# Patient Record
Sex: Male | Born: 1950 | Race: White | Hispanic: No | Marital: Married | Smoking: Never smoker
Health system: Southern US, Community
[De-identification: ages and names within clinical notes are randomized; demographics above are authoritative.]

## PROBLEM LIST (undated history)

## (undated) HISTORY — PX: NOSE SURGERY: SHX723

---

## 2010-11-13 ENCOUNTER — Ambulatory Visit: Payer: Self-pay | Admitting: Sports Medicine

## 2010-12-23 ENCOUNTER — Ambulatory Visit (INDEPENDENT_AMBULATORY_CARE_PROVIDER_SITE_OTHER): Payer: BC Managed Care – PPO | Admitting: Sports Medicine

## 2010-12-23 ENCOUNTER — Encounter: Payer: Self-pay | Admitting: Sports Medicine

## 2010-12-23 VITALS — BP 119/78 | Ht 72.0 in | Wt 185.0 lb

## 2010-12-23 DIAGNOSIS — M79606 Pain in leg, unspecified: Secondary | ICD-10-CM | POA: Insufficient documentation

## 2010-12-23 DIAGNOSIS — M79609 Pain in unspecified limb: Secondary | ICD-10-CM

## 2010-12-23 NOTE — Assessment & Plan Note (Addendum)
Evidence of hamstring tightness and core hip weakness, mostly abduction.  Advised to continue pilates to increase hamstring flexibility and given exercises to work to increase hip abduction/core strength for overall fitness.  Follow-up as needed.  Also given a series of hamstring exercises to improve strength and lessen the symptoms he gets with hiking. Note his hamstring weakness was mild and worse on the left than the right.  He was also given a compression sleeve to use when he is hiking or doing a lot of physical activity for left hamstring.

## 2010-12-23 NOTE — Progress Notes (Signed)
  Subjective:    Patient ID: Alejandro Ward, male    DOB: [birth year], 61 y.o.   MRN: [id number]  HPI 60 yo male here for overall musculoskeletal wellness evaluation.  Patient works as an Pensions consultant, likes to walk and backpacks annually.  He also plays golf for physical activity.  Goes to the gym several times weekly spending time on the treadmill, cycling, and pilates.  Had history of low back pain, but has resolved since starting pilates in April. Currently has a sensation described as "jelly" or possibly stiffness, achiness in the back of his legs at night and sometimes before starting physical activity.  Resolves after warming up and walking around.    No weakness, numbness, tingling.     Review of Systems See HPI    Objective:   Physical Exam Gen: alert NAD, healthy appearing male  Back Exam: Inspection: normal Motion: good flexion and extension, does not elicit pain SLR lying: normal  Standing HS Flexibility:decreased, able to reach to mid shin before bending knees Palpable tenderness: no FABER: normal  Strength at foot Plantar-flexion: 5 / 5    Dorsi-flexion:  5/ 5    Eversion:  5/ 5   Inversion: 5 / 5 Leg strength Quad: 5 / 5   Hamstring: 4 / 5   Hip flexor:  5/ 5   Hip abductors: 4 / 5 Gait Walking:          Heels: wnl          Toes:   wnl         Tandem: difficulty with strength/balance, unable to cross midline while walking          Assessment & Plan:

## 2012-12-02 ENCOUNTER — Ambulatory Visit (INDEPENDENT_AMBULATORY_CARE_PROVIDER_SITE_OTHER): Payer: BC Managed Care – PPO | Admitting: Family Medicine

## 2012-12-02 ENCOUNTER — Encounter: Payer: Self-pay | Admitting: Family Medicine

## 2012-12-02 VITALS — BP 126/82 | Ht 72.0 in | Wt 190.0 lb

## 2012-12-02 DIAGNOSIS — M12811 Other specific arthropathies, not elsewhere classified, right shoulder: Secondary | ICD-10-CM | POA: Insufficient documentation

## 2012-12-02 DIAGNOSIS — S43421A Sprain of right rotator cuff capsule, initial encounter: Secondary | ICD-10-CM

## 2012-12-02 DIAGNOSIS — S43429A Sprain of unspecified rotator cuff capsule, initial encounter: Secondary | ICD-10-CM

## 2012-12-02 MED ORDER — IBUPROFEN 600 MG PO TABS: ORAL_TABLET | ORAL | Status: DC

## 2012-12-02 NOTE — Progress Notes (Signed)
  Subjective:    Patient ID: Alejandro Ward, male    DOB: 08-Jul-1951, 62 y.o.   MRN: [id number]  HPI  Had a fall in November of 2013 injuring both shoulders and his right leg. He continues to have right shoulder pain since then. He stepped forward and fell off a train platform, scraping his leg and landing on his feet. He was wearing a backpack at that time and tried to catch himself with his arms, ranging his shoulder.  Since then he's had intermittent right shoulder pain it has not really improved. It keeps him awake at night. Certain activities cause increase in pain. He is right-hand dominant. No numbness or tingling in his hand or arm, he's not had any weakness.  Review of Systems Denies fever or, denies swelling of the right upper extremity.    Objective:   Physical Exam  Vital signs are reviewed GENERAL: Well-developed male no acute distress Shoulder, right. Passive full range of motion. Active range of motion limited in supraspinatus testing secondary to pain but the strength is intact. Internal and external rotation intact although somewhat painful on internal rotation. Distally his neuro grossly intact. The bicep is mildly tender to palpation. The bicep is nontender palpation in the tendon is intact. Distally he is neurovascularly intact.      Assessment & Plan:  Rotator cuff strain. We discussed options including rehabilitation program plus minus corticosteroid injection, plus minus nitroglycerin patch. He opted for home exercise rehabilitation program in followup in 4 weeks. I suspect she'll do quite well.

## 2013-01-02 ENCOUNTER — Encounter: Payer: Self-pay | Admitting: Family Medicine

## 2013-01-02 ENCOUNTER — Ambulatory Visit (INDEPENDENT_AMBULATORY_CARE_PROVIDER_SITE_OTHER): Payer: BC Managed Care – PPO | Admitting: Family Medicine

## 2013-01-02 VITALS — BP 131/77 | Ht 72.0 in | Wt 185.0 lb

## 2013-01-02 DIAGNOSIS — Z5189 Encounter for other specified aftercare: Secondary | ICD-10-CM

## 2013-01-02 DIAGNOSIS — S43421D Sprain of right rotator cuff capsule, subsequent encounter: Secondary | ICD-10-CM

## 2013-01-02 NOTE — Patient Instructions (Addendum)
Your insurance requires x-rays prior to MRI so we will set this up. I will call you within a day or so after the MRI results are in. Please call in the interim with problems.

## 2013-01-02 NOTE — Assessment & Plan Note (Signed)
Only 10% improved on home exercise program. Discussed options including corticosteroid injection, but glycerin patch, further imaging. After 2 proceed with MRI. In the interim we'll continue exercises we'll call him after results of MRI.

## 2013-01-02 NOTE — Progress Notes (Signed)
  Subjective:    Patient ID: Alejandro Ward, male    DOB: 1950-09-09, 62 y.o.   MRN: [id number]  HPI Followup right shoulder pain. Has been doing exercises regularly. Has also started doing breaststroke swimming. Swelling seems to improve things. New symptom of mid bicep pain. Overall he is only 10% improved.   Review of Systems No new finger or hand numbness.    Objective:   Physical Exam Vital signs are reviewed GENERAL: Well-developed male no acute distress SHOULDER: Bilaterally symmetrical RIGHT: Full range of motion with normal strength in all planes the rotator cuff. Biceps tendon is tender to palpation at the origin and down to the midportion of the tendon. Distally he is neurovascularly intact       Assessment & Plan:

## 2013-01-12 ENCOUNTER — Ambulatory Visit
Admission: RE | Admit: 2013-01-12 | Discharge: 2013-01-12 | Disposition: A | Discharge status: Home / Self Care | Payer: BC Managed Care – PPO | Source: Ambulatory Visit | Attending: Family Medicine | Admitting: Family Medicine

## 2013-01-12 DIAGNOSIS — S43421D Sprain of right rotator cuff capsule, subsequent encounter: Secondary | ICD-10-CM

## 2013-01-17 ENCOUNTER — Other Ambulatory Visit: Payer: BC Managed Care – PPO

## 2013-01-17 ENCOUNTER — Ambulatory Visit (INDEPENDENT_AMBULATORY_CARE_PROVIDER_SITE_OTHER): Payer: BC Managed Care – PPO | Admitting: Sports Medicine

## 2013-01-17 VITALS — BP 110/70 | Ht 72.0 in | Wt 185.0 lb

## 2013-01-17 DIAGNOSIS — M719 Bursopathy, unspecified: Secondary | ICD-10-CM

## 2013-01-17 DIAGNOSIS — M12519 Traumatic arthropathy, unspecified shoulder: Secondary | ICD-10-CM

## 2013-01-17 DIAGNOSIS — M7550 Bursitis of unspecified shoulder: Secondary | ICD-10-CM

## 2013-01-17 DIAGNOSIS — M12811 Other specific arthropathies, not elsewhere classified, right shoulder: Secondary | ICD-10-CM

## 2013-01-17 MED ORDER — NITROGLYCERIN 0.2 MG/HR TD PT24: 1.0000 | MEDICATED_PATCH | Freq: Every day | TRANSDERMAL | Status: DC

## 2013-01-17 MED ORDER — MELOXICAM 15 MG PO TABS: 15.0000 mg | ORAL_TABLET | Freq: Every day | ORAL | Status: DC

## 2013-01-17 NOTE — Patient Instructions (Addendum)
For the biceps tear and rotator cuff tear, we are going to start nitroglycerin patches.  Cut patch into one - fourth pieces. Place a one fourth piece of patch on  skin over affected area, changing to a new piece every 24 hours.  Also use the anti inflammatory medicine mobic daily with food.   Do the exercise to continue with mobility of the shoulder.   Follow up in 1 month.

## 2013-01-17 NOTE — Assessment & Plan Note (Signed)
We will try NTG for left shoulder as well  No tear noted on this side

## 2013-01-17 NOTE — Progress Notes (Signed)
  Subjective:    Patient ID: Katie Moch, male    DOB: 1950/11/30, 62 y.o.   MRN: [id number]  HPI 62 yo male presents for follow up of shoulder pain. Pain was initially present in right shoulder but he now reports pain in left shoulder as well.  Initial injury occurred when falling in between a track and a train after slipping off the stairs to the train last November. He doesn't remember exactly how he fell, but he had a backpack on and he was then lifted out by the arms and shoulders. This was in [place]. He since then has had pain in the anterior/biceps area of the right shoulder. He was seen in May and started doing strengthening exercises bilaterally and started noticing pain in the left shoulder as well.  Pain is worst with putting on jackets and reaching backwards. It is also worst at night and wakes him up from sleep. He denies any numbness, tingling or weakness.  He has been taking ibuprofen which helps when he takes it at a higher dose.    Review of Systems Negative except per HPI    Objective:   Physical Exam Filed Vitals:   01/17/13 1424  BP: 110/70   General: pleasant gentleman in no acute distress Shoulder: no soft tissue swelling, joint effusion or skin discoloration bilaterally No tenderness along AC joint bilaterally, tenderness along proximal biceps on right.  Normal strength with external and internal rotation bilaterally, Pain with internal rotation and adduction bilaterally, scratch test painful bilaterally R>L,  pain with elevation of arms above head bilaterally Normal Hawkins Negative Empty Can Negative O'Briens Normal speeds   Ultrasound:  Right shoulder:  Partial tear of bicipital tendon supraspinatus tear with calcifications at distal end of tendon with significantly increased doppler flow, Subacromial bursitis with increased fluid AC jont only mild DJD  Left shoulder: no evidence of tear or increased doppler flow. Subacromial bursitis present.  RC  tendons look normal;  AC joint normal.  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 Very mild acromial spurring is present. There is also mild degenerative change involving the right AC joint. No acute fracture is seen.  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 No acute abnormality.     Assessment & Plan:  62 yo male who presents with bilateral shoulder pain, right worst than left.  Right shoulder shows evidence of partial bicipital tendon and supraspinatus tears. Bilateral bursitis is also present and likely contributing to symptoms.  - start nitroglycerin protocol 1/4 patch for partial rotator cuff tear - start simple and gentle exercises that promote mobility of shoulder to avoid frozen shoulder.  - start mobic daily for bursitis - follow up in 1 month with potential for repeat US to monitor progress of healing.

## 2013-01-17 NOTE — Assessment & Plan Note (Signed)
Limit to motion exercises  NTG protocol  meloxicam  Reck 1 mo  If pain too severe we will inject for relief

## 2013-03-01 ENCOUNTER — Ambulatory Visit (INDEPENDENT_AMBULATORY_CARE_PROVIDER_SITE_OTHER): Payer: BC Managed Care – PPO | Admitting: Sports Medicine

## 2013-03-01 VITALS — BP 134/80 | Ht 72.0 in | Wt 185.0 lb

## 2013-03-01 DIAGNOSIS — M12519 Traumatic arthropathy, unspecified shoulder: Secondary | ICD-10-CM

## 2013-03-01 DIAGNOSIS — M7500 Adhesive capsulitis of unspecified shoulder: Secondary | ICD-10-CM

## 2013-03-01 DIAGNOSIS — Z0000 Encounter for general adult medical examination without abnormal findings: Secondary | ICD-10-CM

## 2013-03-01 DIAGNOSIS — M75101 Unspecified rotator cuff tear or rupture of right shoulder, not specified as traumatic: Secondary | ICD-10-CM

## 2013-03-01 NOTE — Assessment & Plan Note (Signed)
I think the key issue in his shoulder at this time as restoring normal range of motion  I kept him on a series of Codman exercises  I added some more aggressive overhead stretching  Since he feels he is improving --RTC again in 4-6 weeks  He will take vitamin C 500 but does not want to take other medications

## 2013-03-01 NOTE — Assessment & Plan Note (Signed)
Strength is better and he seems to be healing  We will repeat his ultrasound scan of the next visit

## 2013-03-01 NOTE — Progress Notes (Signed)
Patient ID: Alejandro Ward, male   DOB: [birth year], 62 y.o.   MRN: [id number]  Now returning with some improvement in shoulder sxs Now with less pain Did not use NTG patches as pain was improving He still has stiffness but is able to get better motion in both shoulders When he tried some strengthening exercises originally got worse With the Codman series he is now able to climb the walls and get a better stretch behind his back   Originally fell from train step 11 mos ago Pulled up by arms - landed on bottom  Strong Fam Hx of DM No known thyroid probs  Mother died at 18 with DM and was thin  Physical examination No acute distress  He is lacking more than 30 of elevation on both sides Him back scratch she can only get to L2 on both sides External rotation and is normal on the right but about 15 limited on the left Internal rotation in front of his body is relatively normal  Today rotator cuff testing including speeds, Yergason's, empty can and Hawkins tests are all normal and do not precipitate padding Strength is good to abduction and to initiation of elevation and with internal and external rotation

## 2013-03-01 NOTE — Patient Instructions (Addendum)
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

## 2013-03-02 ENCOUNTER — Other Ambulatory Visit: Payer: Self-pay | Admitting: *Deleted

## 2013-03-02 DIAGNOSIS — M7500 Adhesive capsulitis of unspecified shoulder: Secondary | ICD-10-CM

## 2013-03-10 ENCOUNTER — Other Ambulatory Visit: Payer: BC Managed Care – PPO

## 2013-03-10 DIAGNOSIS — M7500 Adhesive capsulitis of unspecified shoulder: Secondary | ICD-10-CM

## 2013-03-10 NOTE — Progress Notes (Signed)
TSH AND GLUCOSE DONE TODAY,PT DID STATE HAD A GLASS OF OJ TODAY Alejandro Ward

## 2013-03-11 LAB — GLUCOSE, FASTING: Glucose, Fasting: 87 mg/dL (ref 70–99)

## 2013-03-14 ENCOUNTER — Telehealth: Payer: Self-pay | Admitting: *Deleted

## 2013-03-14 NOTE — Telephone Encounter (Signed)
Left pt a VM to call back

## 2013-03-14 NOTE — Telephone Encounter (Signed)
Message copied by Mora Bellman on Tue Mar 14, 2013  5:31 PM ------      Message from: Enid Baas      Created: Tue Mar 14, 2013  4:53 PM       Let him know that his laboratory tests were normal. I do not think these are causing his shoulder problems.Ask how his shoulder is doing. ------

## 2013-03-16 NOTE — Telephone Encounter (Signed)
Spoke with pt, advised him of normal labs.  He states his shoulder is feeling better, still some pain with certain motions.  He will follow up 04/05/13.

## 2013-04-05 ENCOUNTER — Ambulatory Visit (INDEPENDENT_AMBULATORY_CARE_PROVIDER_SITE_OTHER): Payer: BC Managed Care – PPO | Admitting: Sports Medicine

## 2013-04-05 ENCOUNTER — Encounter: Payer: Self-pay | Admitting: Sports Medicine

## 2013-04-05 VITALS — BP 100/67 | HR 59 | Ht 72.0 in | Wt 185.0 lb

## 2013-04-05 DIAGNOSIS — M12811 Other specific arthropathies, not elsewhere classified, right shoulder: Secondary | ICD-10-CM

## 2013-04-05 DIAGNOSIS — M7501 Adhesive capsulitis of right shoulder: Secondary | ICD-10-CM

## 2013-04-05 DIAGNOSIS — M12519 Traumatic arthropathy, unspecified shoulder: Secondary | ICD-10-CM

## 2013-04-05 DIAGNOSIS — M7500 Adhesive capsulitis of unspecified shoulder: Secondary | ICD-10-CM

## 2013-04-05 MED ORDER — AMITRIPTYLINE HCL 25 MG PO TABS: 25.0000 mg | ORAL_TABLET | Freq: Every day | ORAL | Status: DC

## 2013-04-05 NOTE — Patient Instructions (Signed)
Thank you for coming in today  Continue your home exercises for range of motion Continue vitamin C Try amitriptyline at night. Monitor for urinary problems

## 2013-04-05 NOTE — Progress Notes (Signed)
CC: Followup right shoulder pain HPI: Patient is a very pleasant 62 year old male who we have been seeing for right shoulder pain. His initial injury occurred when he had a fall while hiking in [place] and injured his right shoulder. He had evidence of partial supraspinatus tear as well as partial biceps tendon tear on initial ultrasound. He subsequently developed frozen shoulder. He presents for followup today. Patient states that his pain and range of movement motions seem to be improving. He does have some pain intermittently especially with putting on a sport coat. He is doing his home exercises for range of motion and notices that his flexibility seems to be improving. He has minimal pain at night and is sleeping better through the night. He never used the nitroglycerin because he was concerned about side effects. He is on the vitamin C.  ROS: As above in the HPI. All other systems are stable or negative.  OBJECTIVE: APPEARANCE:  Patient in no acute distress.The patient appeared well nourished and normally developed. HEENT: No scleral icterus. Conjunctiva non-injected Resp: Non labored Skin: No rash MSK:  Right Shoulder - No swelling or deformity - No TTP over AC joint, biceps tendon, or lateral humerus - Some loss of ROM. Loss of 20 of forward flexion. Internal rotation on back scratch limited to upper lumbar vertebra. External rotation to 30.  - Strength 5/5 on shoulder abduction, internal rotation, external rotation, empty can - Negative empty can, Hawkin's, Neer's for impingement - Labrum: Negative O'Briens - Neurovascularly intact   MSK [place]: Ultrasound of the right shoulder was performed in transverse and longitudinal views. Biceps tendon was visualized and shows hypoechoic change in the area surrounding the tendon. Previous gap appears decreased in size and there is scar tissue hyperechoic change present. Supraspinatus is without tear and appears well-healed. Subscap, teres Minor,  infraspinatus also visualized and normal in appearance. No impingement present on dynamic testing of the subscapularis or supraspinatus.  ASSESSMENT: #1. Right rotator cuff previous partial tear of the supraspinatus and biceps tendon complicated by frozen shoulder now with good evidence of healing of the supraspinatus and improving range of motion with persistent swelling around the biceps tendon  PLAN: Patient seems to be making good progress at this time. His pain is decreased and he is sleeping better at night. Ultrasound shows good progression of healing at this point. He still does have loss of range of motion consistent with frozen shoulder at this point. We would like him to try amitriptyline 25 mg each bedtime. He will continue his range of motion exercises at home. Continue vitamin C. We'll have her followup with [place] in about 6 weeks.

## 2013-05-18 ENCOUNTER — Ambulatory Visit (INDEPENDENT_AMBULATORY_CARE_PROVIDER_SITE_OTHER): Payer: BC Managed Care – PPO | Admitting: Sports Medicine

## 2013-05-18 ENCOUNTER — Encounter: Payer: Self-pay | Admitting: Sports Medicine

## 2013-05-18 VITALS — BP 123/75 | Ht 72.0 in | Wt 185.0 lb

## 2013-05-18 DIAGNOSIS — M7500 Adhesive capsulitis of unspecified shoulder: Secondary | ICD-10-CM

## 2013-05-18 NOTE — Assessment & Plan Note (Signed)
He is making good progress in getting back to 100% normal range of motion  Overall my exam he has some limitations of motion he doesn't feel that this affects anything that he's doing on a daily basis  He would like to try some golf but has been hesitant   I advised him to continue on his motion exercises and had a very light dumbbell to increase the motion He can increase his activities even hitting a few golf balls if desired  Recheck with me in 2 months to see if this motions  is closer to 100%

## 2013-05-18 NOTE — Progress Notes (Signed)
Patient ID: Alejandro Ward, male   DOB: Jan 05, 1951, 62 y.o.   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 is somewhat his left He started with an injury from a fall about one year ago while traveling in [place] This but when he started his exercises for rotator cuff rehabilitation the shoulder started getting stiff His original injury involved a supraspinatous tear and a partial biceps tendon tear On ultrasound supraspinatous tear appeared healed and the biceps tendon tear with significantly improved  However his motion came worse We started him on progressive range of motion exercises He does not like taking medications and hasn't taken that much medicine but is taking vitamin C  By early fall his left shoulder was starting to get somewhat stiff so started the same motions with that shoulder  He feels that he has almost full range of motion of both shoulders He is not having much pain  Physical examination  No acute distress  Right shoulder shows about 20-30 of lack of full forward flexion and elevation He lacks about 15 of external rotation Back scratch is to lumbar 1 Strength tested at waist level is normal with no pain Abduction and elevation is 150  Left shoulder lacks 20 of full forward flexion and elevation He lacks 15 of external rotation Back scratch is to T12 Abduction and elevation is 160

## 2013-06-16 ENCOUNTER — Encounter: Payer: Self-pay | Admitting: Family Medicine

## 2013-06-16 ENCOUNTER — Ambulatory Visit (INDEPENDENT_AMBULATORY_CARE_PROVIDER_SITE_OTHER): Payer: BC Managed Care – PPO | Admitting: Family Medicine

## 2013-06-16 ENCOUNTER — Ambulatory Visit (HOSPITAL_BASED_OUTPATIENT_CLINIC_OR_DEPARTMENT_OTHER)
Admission: RE | Admit: 2013-06-16 | Discharge: 2013-06-16 | Disposition: A | Discharge status: Home / Self Care | Payer: BC Managed Care – PPO | Source: Ambulatory Visit | Attending: Family Medicine | Admitting: Family Medicine

## 2013-06-16 VITALS — BP 142/80 | HR 62 | Ht 72.0 in | Wt 185.0 lb

## 2013-06-16 DIAGNOSIS — R51 Headache: Secondary | ICD-10-CM

## 2013-06-16 DIAGNOSIS — R42 Dizziness and giddiness: Secondary | ICD-10-CM

## 2013-06-16 NOTE — Patient Instructions (Signed)
Get the CT scan today - we will contact you with the results. If this is normal start taking a baby aspirin 81mg  daily with food. Start exercises shown - do 10 times once or twice a day until vertigo has completely resolved. Consider meclizine. Generally follow up with your family doctor in 1 week to reassess. If you have problems with vision, speech, weakness, numbness, call 911.

## 2013-06-19 ENCOUNTER — Encounter: Payer: Self-pay | Admitting: Family Medicine

## 2013-06-19 DIAGNOSIS — R42 Dizziness and giddiness: Secondary | ICD-10-CM | POA: Insufficient documentation

## 2013-06-19 NOTE — Progress Notes (Signed)
Patient ID: Alejandro Ward, male   DOB: [birth year], 62 y.o.   MRN: [id number]  PCP: No PCP Per Patient  Subjective:   HPI: Patient is a 62 y.o. male here for dizziness.  Patient reports just since this morning he has had intermittent dizziness, room spinning sensation. One time was worse when he stood up quickly. Saw spots before his eyes. Eating, drinking orange juice did not help. No problems like this before. No changes in strength, sensation of extremities, face. Stuttering but reports this is normal for him. Also reports off and on headaches the past few weeks. Has not tried anything for this. Blood pressure elevated today but denies any other medical problems.  History reviewed. No pertinent past medical history.  No current outpatient prescriptions on file prior to visit.   No current facility-administered medications on file prior to visit.    Past Surgical History  Procedure Laterality Date  . Nose surgery      broke as a child in football    No Known Allergies  History   Social History  . Marital Status: Married    Spouse Name: N/A    Number of Children: N/A  . Years of Education: N/A   Occupational History  . Not on file.   Social History Main Topics  . Smoking status: Never Smoker   . Smokeless tobacco: Not on file  . Alcohol Use: Not on file  . Drug Use: Not on file  . Sexual Activity: Not on file   Other Topics Concern  . Not on file   Social History Narrative  . No narrative on file    Family History  Problem Relation Age of Onset  . Diabetes Mother   . Hypertension Mother   . Heart attack Father   . Hyperlipidemia Neg Hx   . Sudden death Neg Hx     BP 142/80  Pulse 62  Ht 6' (1.829 m)  Wt 185 lb (83.915 kg)  BMI 25.08 kg/m2  Review of Systems: See HPI above.    Objective:  Physical Exam:  Gen: NAD  Neuro: A&O x 3 CN 2 - 12 grossly intact.  EOMI, PERRL.  Hearing intact to finger rub.  Pharynx symmetric. Strength 5/5 all  muscle groups bilateral upper and lower extremities. No dysdiadochokinesia. Finger to nose normal. Sensation intact to light touch all extremities    Assessment & Plan:  1. Dizziness - describes new onset vertigo.  Coming and going today not necessarily with movements.  Also reports increase in headache frequency past few weeks.  Advised in up to 10-20% of cases this can be initial presentation of a CVA - go ahead with CT without contrast which was done today and was negative.  Advised he establish care with a PCP (states he has seen Dr. Darrick Penna but that appears to only have been for shoulder pain).  Consider ENT referral if this continues.  Discussed red flags (worsening vertigo, vomiting, vision or speech changes, weakness, sensation change) to warrant calling 911.

## 2013-06-19 NOTE — Assessment & Plan Note (Signed)
describes new onset vertigo.  Coming and going today not necessarily with movements.  Also reports increase in headache frequency past few weeks.  Advised in up to 10-20% of cases this can be initial presentation of a CVA - go ahead with CT without contrast which was done today and was negative.  Advised he establish care with a PCP (states he has seen Dr. Darrick Penna but that appears to only have been for shoulder pain).  Consider ENT referral if this continues.  Discussed red flags (worsening vertigo, vomiting, vision or speech changes, weakness, sensation change) to warrant calling 911.

## 2013-07-04 IMAGING — CR DG SHOULDER 2+V*R*
3 series · 3 of 3 positions shown · non-contrast
Comparison: None.

CLINICAL DATA: Fell onto right shoulder 6 months ago with pain,
limited range of motion

RIGHT SHOULDER - 2+ VIEW

[view not recorded (1 of 3)]
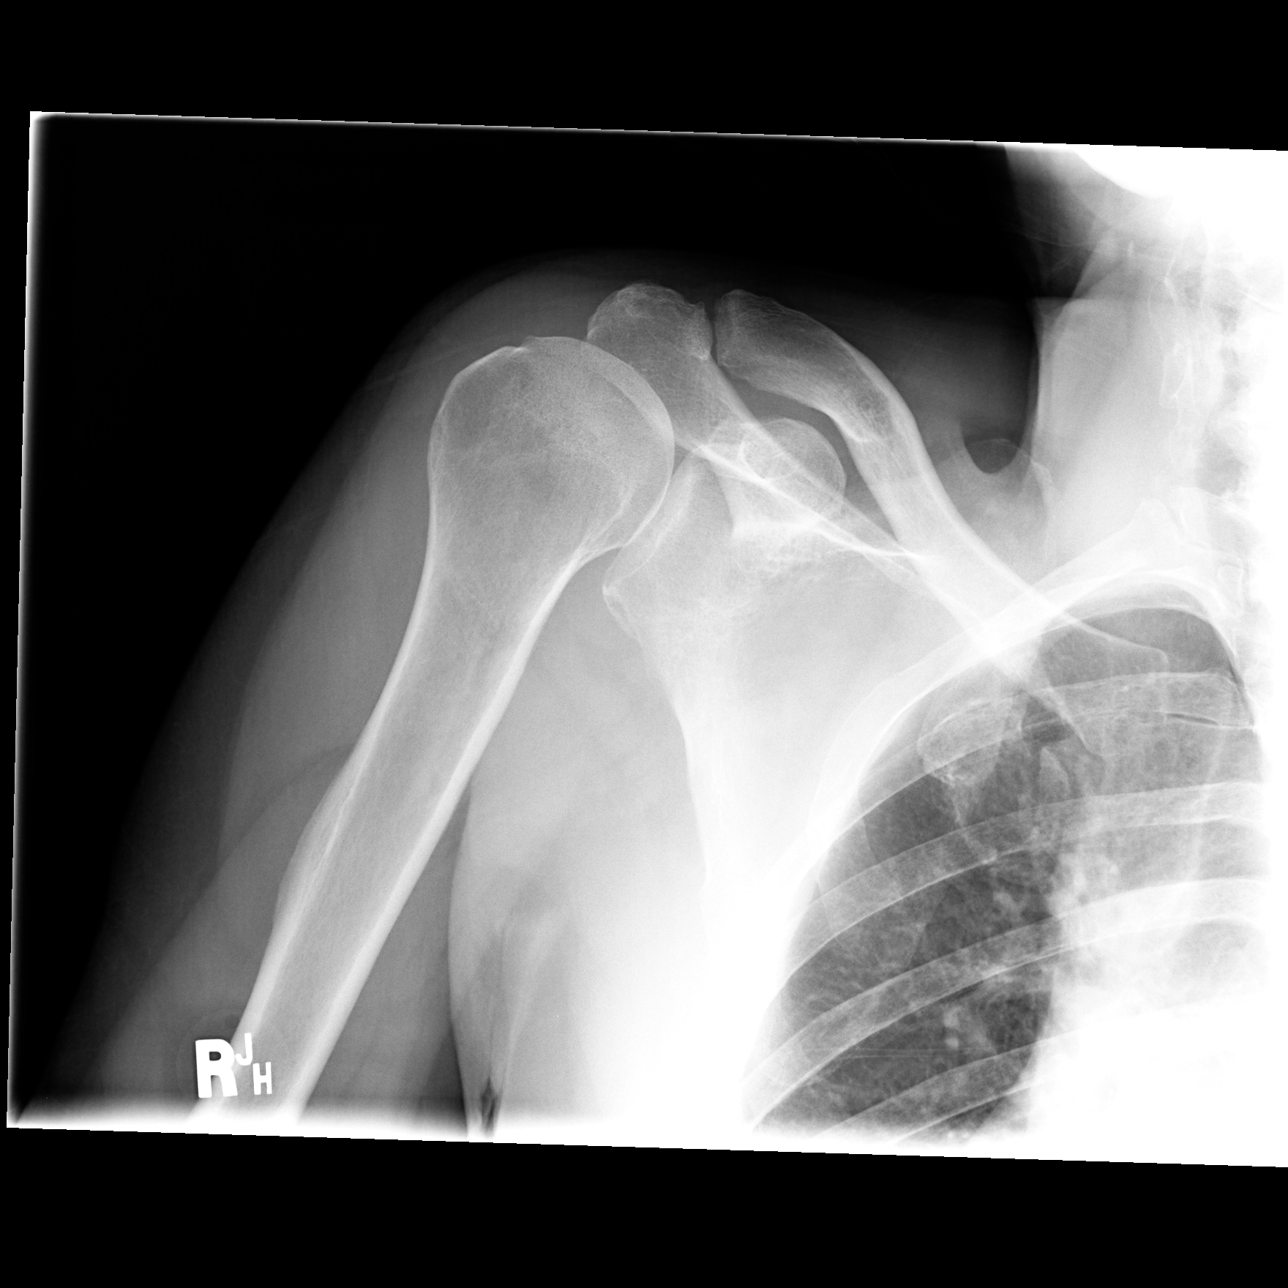

[view not recorded (2 of 3)]
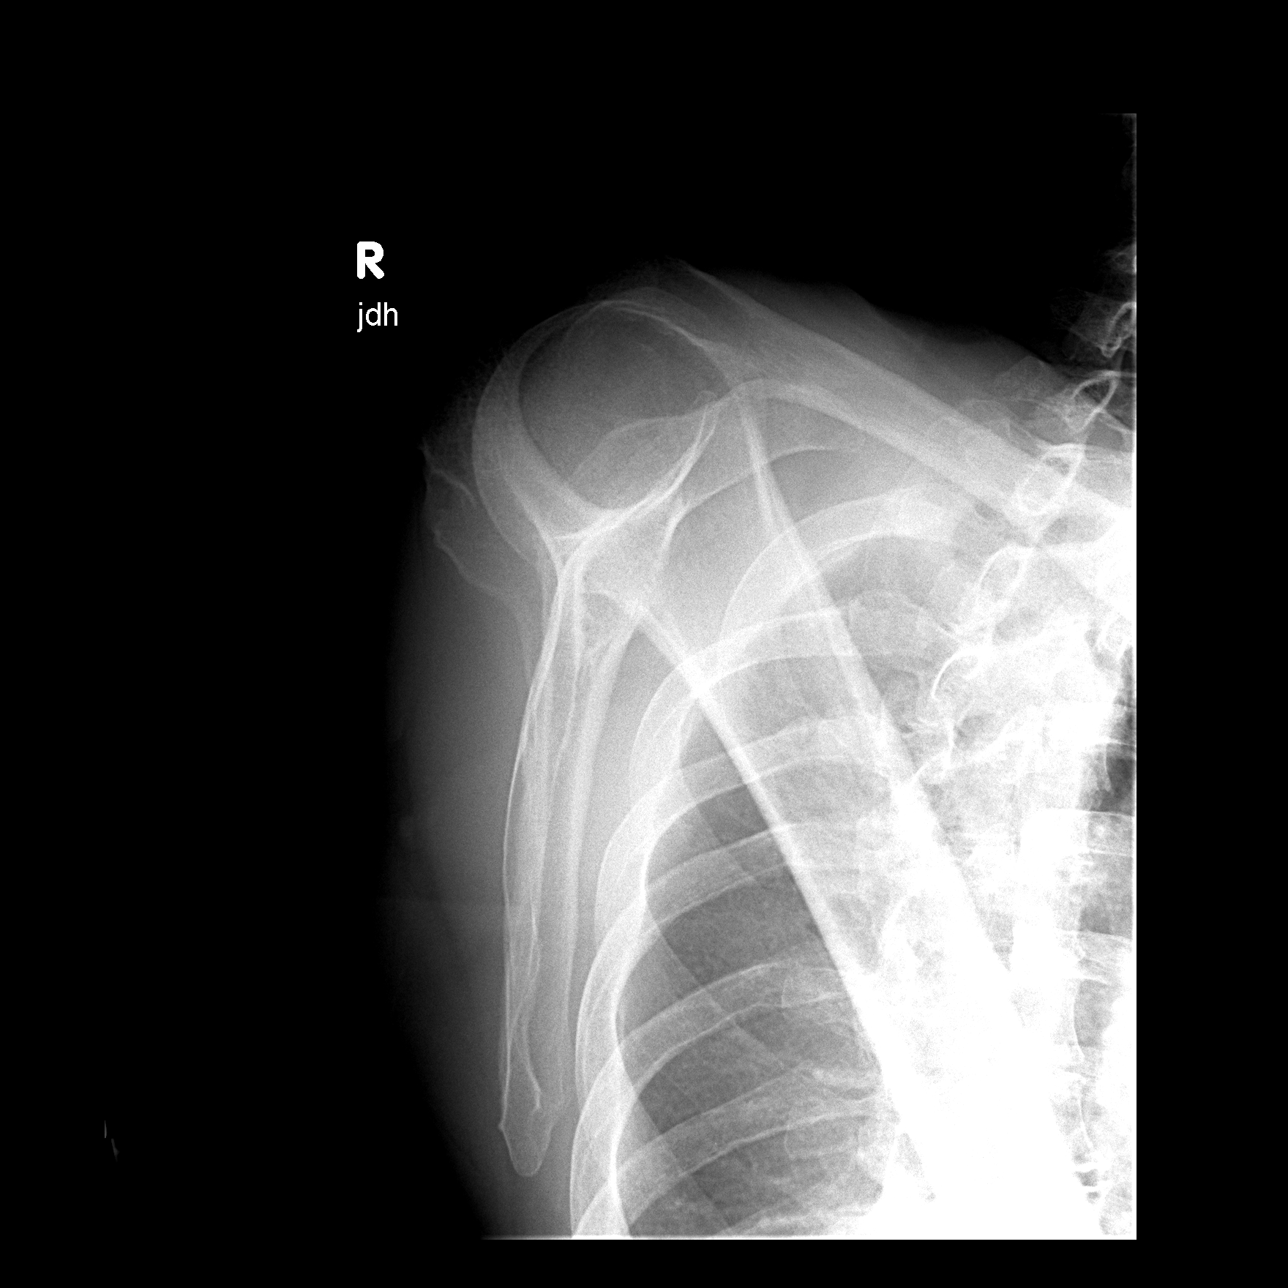

[view not recorded (3 of 3)]
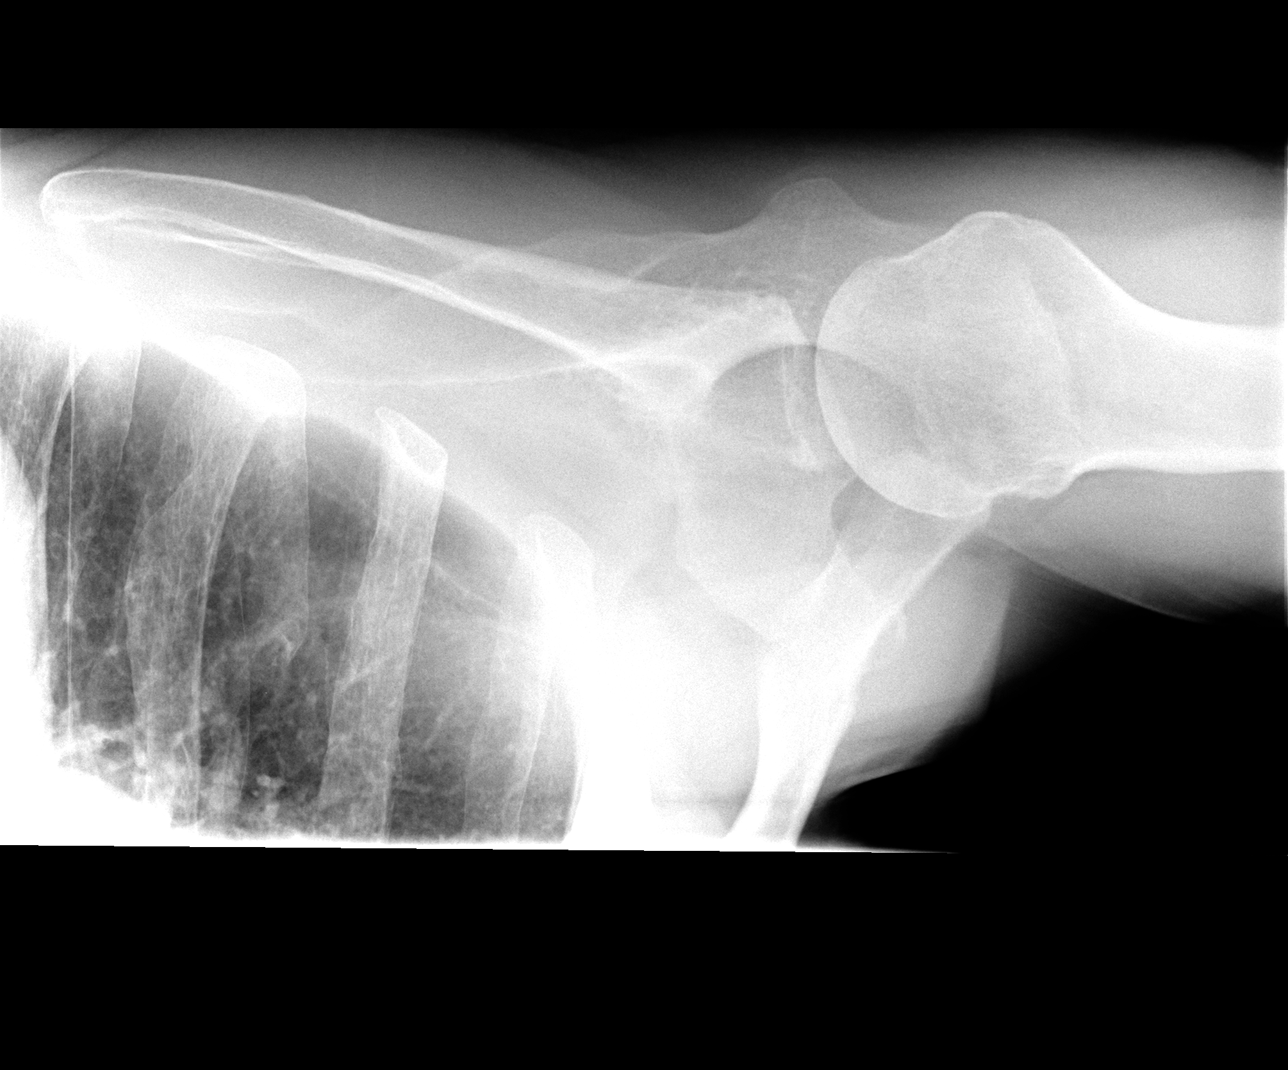

[3 of 3 positions shown; findings below may reference images not displayed]

FINDINGS: There is mild degenerative joint disease of the right
glenohumeral joint space with slight loss of joint space and mild
sclerosis.  Very mild acromial spurring is present.  There is also
mild degenerative change involving the right AC joint.  No acute
fracture is seen.
IMPRESSION: Mild degenerative change of the glenohumeral joint and right AC
joint.  No acute abnormality.

## 2013-08-09 ENCOUNTER — Encounter: Payer: Self-pay | Admitting: Sports Medicine

## 2013-08-09 ENCOUNTER — Ambulatory Visit (INDEPENDENT_AMBULATORY_CARE_PROVIDER_SITE_OTHER): Payer: BC Managed Care – PPO | Admitting: Sports Medicine

## 2013-08-09 VITALS — BP 132/79 | HR 66 | Ht 72.0 in | Wt 185.0 lb

## 2013-08-09 DIAGNOSIS — M7501 Adhesive capsulitis of right shoulder: Secondary | ICD-10-CM

## 2013-08-09 DIAGNOSIS — M7500 Adhesive capsulitis of unspecified shoulder: Secondary | ICD-10-CM

## 2013-08-09 NOTE — Patient Instructions (Signed)
You have resolved your frozen shoulder, you are at higher risk of getting frozen shoulder on left side Minimal chance of getting recurrence on rt shoulder  Continue doing 1 set of motion exercises  Use light weight dumbbell and raise straight ahead, 45 deg, 90 deg, and 135 deg  If you start having tingling during hiking- switch backpack to other shoulder  May need to adjust pack- take foam incase you need to pad strap Take ibuprofen as needed   Please follow up as needed, and enjoy your hiking trip!!  Thank you for seeing [place] today!

## 2013-08-09 NOTE — Progress Notes (Signed)
   Subjective:    Patient ID: Alejandro Ward, male    DOB: [birth year], 63 y.o.   MRN: [id number]  HPI  Pt presents to clinic for f/u of rt shoulder which is significantly improved.   Still notices some discomfort with reaching backward. Sleeping much better. Occasionally takes ibuprofen. Doing recommended shoulder exercises daily.    Review of Systems     Objective:   Physical Exam Strong  NAD  Rt shoulder exam:  Full forward flexion, abduction, and elevation  IR - lacks 5 deg T12 on back scratch, T10 on left side Frozen shoulder test negative today       Assessment & Plan:

## 2013-08-09 NOTE — Assessment & Plan Note (Signed)
This has resolved clincally at this point  Keep up some maintenance exercises and low grade RC strength work  He can recheck with me as needed with plan in 3 to 4 mos

## 2014-01-03 ENCOUNTER — Ambulatory Visit: Payer: BC Managed Care – PPO | Admitting: Emergency Medicine

## 2014-02-07 ENCOUNTER — Ambulatory Visit (INDEPENDENT_AMBULATORY_CARE_PROVIDER_SITE_OTHER): Payer: BC Managed Care – PPO | Admitting: Sports Medicine

## 2014-02-07 ENCOUNTER — Encounter: Payer: Self-pay | Admitting: Sports Medicine

## 2014-02-07 ENCOUNTER — Encounter (INDEPENDENT_AMBULATORY_CARE_PROVIDER_SITE_OTHER): Payer: Self-pay

## 2014-02-07 VITALS — BP 149/86 | HR 60

## 2014-02-07 DIAGNOSIS — M533 Sacrococcygeal disorders, not elsewhere classified: Secondary | ICD-10-CM | POA: Diagnosis not present

## 2014-02-07 NOTE — Patient Instructions (Signed)
[place] 03/02/14 AT 9AM Bovill Witmer [phone]

## 2014-02-07 NOTE — Assessment & Plan Note (Signed)
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

## 2014-02-07 NOTE — Progress Notes (Signed)
Patient ID: Alejandro Ward, male   DOB: [birth year], 63 y.o.   MRN: [id number]  RT LBP to RT SIJ area 1 mo duration Finished 1000K trek 22nd May  Walking [place] about May 31 and this was difficult with pulling  No radiating sxs  Cough and laughing hurt Some pain with defecation  Now has pain worse at night Points to RT SIJ No weakness  Secondary issue is hearing difficulty We will schedule him for audiology evaluation and get him to establish with a PCP  Exam NAD BP 149/86  Pulse 60  Patient is able to do normal heel toe and tandem walk No strength deficits noted in the lower extremity Flexion to 45 brings out mild pain over the right SI joint Extension is only slightly painful Lateral been slight pain to the right Rotation no pain  Hip range of motion was normal Pelvic rock shows good motion of both SI joints  Tenderness to palpation directly over the right SI joint  Standing hip rotation and a side blank are difficult and cause some pain Back bridge causes pain on the right SI joint

## 2014-03-14 ENCOUNTER — Ambulatory Visit (INDEPENDENT_AMBULATORY_CARE_PROVIDER_SITE_OTHER): Payer: BC Managed Care – PPO | Admitting: Sports Medicine

## 2014-03-14 ENCOUNTER — Encounter: Payer: Self-pay | Admitting: Sports Medicine

## 2014-03-14 VITALS — BP 118/75 | HR 60 | Ht 74.0 in | Wt 190.0 lb

## 2014-03-14 DIAGNOSIS — M533 Sacrococcygeal disorders, not elsewhere classified: Secondary | ICD-10-CM | POA: Diagnosis not present

## 2014-03-14 NOTE — Assessment & Plan Note (Signed)
Chronic right sided SI joint and iliolumbar ligament pain.  Complicated by weak core muscles, and weak hip abductor muscles. -Given that he has somewhat failed a home exercise program, we will refer him for a course of formal physical therapy to assist him with reactivating his weakend muscles. -He may continue to take Aleve as needed. -He should follow up in 4 weeks or sooner if need to reassess.

## 2014-03-14 NOTE — Progress Notes (Signed)
   Subjective:    Patient ID: Alejandro Ward, male    DOB: 10-Jul-1951, 63 y.o.   MRN: [id number]  HPI Alejandro Ward Visit 63 year old male who presents for followup of right sided SI joint and low back pain.  He notes that his symptoms were aggravated 2 weeks ago following a trip from [place] to [place].  Over the past week his symptoms have been improving, though wake him up at least once a night.  He is lying down aggravates his symptoms.  He was previously doing hip and SI joint exercises, which she was taught, but has since ceased thing diligent about his exercises.  He will occasionally take Aleve which provides some relief of pain.  He is overall active with his wife in mainly walking.  He denies any radiation of pain down the posterior of her contralateral leg, weakness, numbness, tingling, or deep groin pain.  No past medical history on file. History  Substance Use Topics  . Smoking status: Never Smoker   . Smokeless tobacco: Not on file  . Alcohol Use: Not on file    Review of Systems Pertinent positive: Right SI joint pain, right low back/gluteal pain. Pertinent negative: weakness, numbness, tingling, or deep groin pain 11 point review of systems was performed as otherwise negative.    Objective:   Physical Exam BP 118/75  Pulse 60  Ht 6\' 2"  (1.88 m)  Wt 190 lb (86.183 kg)  BMI 24.38 kg/m2 GEN: The patient is well-developed well-nourished male and in no acute distress.  He is awake alert and oriented x3. SKIN: warm and well-perfused, no rash  EXTR: No lower extremity edema or calf tenderness Neuro: Strength 5/5 globally. Sensation intact throughout. DTRs 2/4 bilaterally. No focal deficits. Vasc: +2 bilateral distal pulses. No edema.  MSK: No leg length discrepancy noted.  Standard structural exam overall unremarkable.  Pelvic compression test with equal movement at the SI joints bilaterally. Weakness with single leg squat right greater than left.  Weak hip abductors on the  right greater than left.  Tenderness to palpation over the right SI joint, but now also mainly at the right iliolumbar ligament.  No bony tenderness is noted.  Negative sork test.  Full range of motion bilateral hip joints with internal and external rotation that is pain free. No tenderness at the ischial tuberosity or hamstring insertion.     Assessment & Plan:  Please see problem list assessment and plan problem list.

## 2014-03-20 ENCOUNTER — Ambulatory Visit: Payer: BC Managed Care – PPO | Admitting: Physical Therapy

## 2014-03-21 ENCOUNTER — Encounter: Payer: Self-pay | Admitting: Sports Medicine

## 2014-03-21 ENCOUNTER — Ambulatory Visit: Payer: BC Managed Care – PPO

## 2014-03-22 ENCOUNTER — Ambulatory Visit: Payer: Self-pay | Admitting: Sports Medicine

## 2014-04-19 ENCOUNTER — Encounter: Payer: Self-pay | Admitting: Sports Medicine

## 2014-04-19 ENCOUNTER — Ambulatory Visit (INDEPENDENT_AMBULATORY_CARE_PROVIDER_SITE_OTHER): Payer: BC Managed Care – PPO | Admitting: Sports Medicine

## 2014-04-19 VITALS — BP 136/89 | HR 72 | Ht 74.0 in | Wt 190.0 lb

## 2014-04-19 DIAGNOSIS — M533 Sacrococcygeal disorders, not elsewhere classified: Secondary | ICD-10-CM

## 2014-04-19 NOTE — Progress Notes (Signed)
  Alejandro Ward - 63 y.o. male MRN [id number]  Date of birth: July 13, 1951  SUBJECTIVE:  Including CC & ROS.  The patient is here to follow up:  Right Low Back Pain & Right Hip Pain  HISTORY:  Alejandro Ward is a pleasant 63 y.o. male who returns for follow-up to right hip pain and low back pain. He states that the low back pain is essentially a non-issue and in regards to his right hip, this has significantly improved. He cites prior nightly early morning waking due to right hip / deep gluteal pain which now only occurs 1-2 times per week. The severity has decreased as well. Pain is still located right lateral hip to right deep buttock, no radiation down the leg, no numbness, tingling.   He has been compliant with his home exercises with the assistance of his wife. He was re-referred to physical therapy but chose to pursue home exercises instead. He has not needed to take Aleve anymore. He continues to remain active with walking and going to the gym.  ROS: No fevers, chills, trauma, otherwise per HPI  OBJECTIVE FINDINGS:  VS:  Filed Vitals:   04/19/14 0827  BP: 136/89  Pulse: 72  Height: 6\' 2"  (1.88 m)  Weight: 190 lb (86.183 kg)   PHYSICAL EXAM:            GENERAL:  Caucasian male. In no discomfort; no respiratory distress                PSYCH:  alert and appropriate, good insight  Right hip Exam:              APPEAR:  No gross abnormality or skin discoloration noted                    ROM:  Full and painless flexion, extension, internal, external noted. FABER painless     PALPATION: Tenderness at right, superior portion of iliac crest, no SI pain elicited bilaterally, no paraspinal or lumbar tenderness        STRENGTH:  Improved single leg squat on right, essentially symmetric to contra; hip abductor strength diminished on right                  NV:  Sensation intact bilateral lower extremities; 2+ achilles and patellar bilaterally  ASSESSMENT & PLAN:  1. Right hip pain Overall  improved with home strengthening exercises. Hip abductor weakness underlying etiology being appropriately addressed with exercises.  - Continue regimen, strength 80% - Will follow-up at end of October, early November only if not improved - at that time can consider imaging gluteus muscles to evaluate for injury   Rosette Reveal, MD Conni Elliot FMR PGY3  Reviewed and edited  Ila Mcgill, MD

## 2014-04-19 NOTE — Assessment & Plan Note (Signed)
This has steadily improved  He has regained most of strength  No direct pain at SIJ now and good movement of both SIJs

## 2014-05-31 ENCOUNTER — Encounter (INDEPENDENT_AMBULATORY_CARE_PROVIDER_SITE_OTHER): Payer: Self-pay

## 2014-05-31 ENCOUNTER — Ambulatory Visit (INDEPENDENT_AMBULATORY_CARE_PROVIDER_SITE_OTHER): Payer: BC Managed Care – PPO | Admitting: Sports Medicine

## 2014-05-31 VITALS — BP 108/80

## 2014-05-31 DIAGNOSIS — M533 Sacrococcygeal disorders, not elsewhere classified: Secondary | ICD-10-CM | POA: Diagnosis not present

## 2014-05-31 NOTE — Progress Notes (Signed)
  Alejandro Ward - 63 y.o. male MRN [id number]  Date of birth: 31-Mar-1951  CC & HPI:  Patient is here for follow-up on: Right Low Back Pain: He reports overall being significantly improved. Denies any significant radicular symptoms, no new weakness. He did have an exacerbation of his symptoms after he stopped doing his exercises on a business trip for 10 days. Symptoms have resolved since resuming exercises. Pain is mainly located in the right gluteal region. Occasionally will still wake him up at night. Not taking any specific medications. Not significantly limiting his daily activity.  ROS:  Per HPI.   OBJECTIVE FINDINGS:  VS:   HT:    WT:   BMI:           BP:108/80 mmHg  HR: bpm  TEMP: ( )  RESP:   PHYSICAL EXAM: GENERAL: Adult caucasian  male. In no discomfort; no respiratory distress   PSYCH: alert and appropriate, good insight   NEURO: Sensation is intact to light touch in bilateral LE  VASCULAR: No significant edema.   Back EXAM: Appearance: Normal appearing, no significant scoliosis.   Skin: No overlying erythema/ecchymosis.  Palpation: TTP over: right superior aspect of gluteals  No TTP over: bilateral piriformis,   Strength & ROM: 5/5 Strength and full active ROM in: hip internal/external rotation  Weakness in: Right>Left hip ABduction with glute medius isolation  Special Tests: Overall Symmetric leg lengths, slight short left leg corrects with sitting; restricted ASIS compression test bilateral; worse on right;    ASSESSMENT: 1. Sacroiliac joint dysfunction of right side    - overall significantly improved  PLAN: See problem based charting & AVS for additional documentation. - HEP reviewed and hip ABDUCTION exercises modified to isolate glute medius (wall lifts) - Will follow up for SI joint manipulation in OMT clinic > Return in 3 weeks (on 06/22/2014) for [facility].

## 2014-05-31 NOTE — Patient Instructions (Signed)
Remember to feel the exercises in the Back part of the buttock.

## 2014-06-22 ENCOUNTER — Encounter: Payer: Self-pay | Admitting: Sports Medicine

## 2014-06-22 ENCOUNTER — Ambulatory Visit (INDEPENDENT_AMBULATORY_CARE_PROVIDER_SITE_OTHER): Payer: BC Managed Care – PPO | Admitting: Sports Medicine

## 2014-06-22 VITALS — BP 126/82 | Ht 74.0 in | Wt 190.0 lb

## 2014-06-22 DIAGNOSIS — M9902 Segmental and somatic dysfunction of thoracic region: Secondary | ICD-10-CM | POA: Diagnosis not present

## 2014-06-22 DIAGNOSIS — M533 Sacrococcygeal disorders, not elsewhere classified: Secondary | ICD-10-CM | POA: Diagnosis not present

## 2014-06-22 DIAGNOSIS — M9903 Segmental and somatic dysfunction of lumbar region: Secondary | ICD-10-CM

## 2014-06-22 DIAGNOSIS — M9905 Segmental and somatic dysfunction of pelvic region: Secondary | ICD-10-CM | POA: Diagnosis not present

## 2014-06-22 NOTE — Progress Notes (Signed)
  Alejandro Ward - 63 y.o. male MRN [id number]  Date of birth: 05-12-1951  CC & HPI:  Patient presents to OMT clinic for: Right SI region pain: Reports overall significantly improved but will continue to have discomfort at the end of the day. Denies numbness, tingling, weakness. Occasional low back pain focally located over the right low back.  ROS:  Per HPI.   OBJECTIVE:  VS:   HT:6\' 2"  (188 cm)   WT:190 lb (86.183 kg)  BMI:24.4          BP:126/82 mmHg  HR: bpm  TEMP: ( )  RESP:   PHYSICAL EXAM: GENERAL: adult Caucasian male. In no discomfort; no respiratory distress   PSYCH: alert and appropriate, good insight   NEUROMSK: sensation is intact to light touch in bilateral lower extremities.  Difficulty relaxing.  Hip abduction strength TFL predominant and 5+/5   VASCULAR: No significant edema.    OSTEOPATHIC Exam: LEG LENGTH: Slight pseudo-short right leg       POSTURE: Anterior chain dominant  SOMATIC DYSFUNCTION         Thoracic: T8 extended rotated right            Lumbar: L3 rotated right           Sacrum: Left on left               Pelvis: Right anterior shear                   LE: functional Limited external rotation of right hip   ASSESSMENT: 1. Sacroiliac joint dysfunction of right side   2. Somatic dysfunction of lumbar region   3. Somatic dysfunction of pelvis region   4. Somatic dysfunction of thoracic region    PROCEDURE NOTE : Osteopathic Manipulation. The decision today to treat with OMT was based on physical exam. Verbal consent was obtained after after explanation of risks, benefits and potential side effects, including acute pain flare, post manipulation soreness and need for repeat treatments.             Regions treated:  Lumbar, Pelvis, Thoracic          Techniques used:  HVLA, MFR and ME A patient tolerated procedure well with reported improvement in symptoms. Patient given medications, exercises, stretches and lifestyle modifications per AVS and verbally.     PLAN: See problem based charting & AVS for additional documentation.  Treatment as above.   Continue with home exercise program including focusing on hip abduction strengthening. He does still have a predominant TFL hip abduction recruitment pattern > Return in about 4 weeks (around 07/20/2014), or if symptoms worsen or fail to improve, for [facility].

## 2014-07-20 ENCOUNTER — Ambulatory Visit: Payer: BC Managed Care – PPO | Admitting: Sports Medicine

## 2014-07-25 ENCOUNTER — Encounter: Payer: Self-pay | Admitting: Sports Medicine

## 2014-07-25 ENCOUNTER — Ambulatory Visit (INDEPENDENT_AMBULATORY_CARE_PROVIDER_SITE_OTHER): Payer: BC Managed Care – PPO | Admitting: Sports Medicine

## 2014-07-25 ENCOUNTER — Encounter (INDEPENDENT_AMBULATORY_CARE_PROVIDER_SITE_OTHER): Payer: Self-pay

## 2014-07-25 VITALS — BP 123/83 | Ht 74.0 in | Wt 190.0 lb

## 2014-07-25 DIAGNOSIS — M533 Sacrococcygeal disorders, not elsewhere classified: Secondary | ICD-10-CM

## 2014-07-25 DIAGNOSIS — M9905 Segmental and somatic dysfunction of pelvic region: Secondary | ICD-10-CM

## 2014-07-25 DIAGNOSIS — M9904 Segmental and somatic dysfunction of sacral region: Secondary | ICD-10-CM

## 2014-07-25 DIAGNOSIS — M9903 Segmental and somatic dysfunction of lumbar region: Secondary | ICD-10-CM

## 2014-07-25 NOTE — Progress Notes (Signed)
  Adolpho Meenach - 63 y.o. male MRN [id number]  Date of birth: [birth year]  CC & HPI:  Patient presents to OMT clinic for: Right SI region pain: Reports some worsening since last visit. Reportedly did well for 1-2 weeks then around the time of Thanksgiving began having worsening right sided SI region pain. Denies any radicular symptoms. He did travel to [place] but reports no other activities outside of this. Has been compliant with his home exercises. Denies numbness, tingling, weakness.   ROS:  Per HPI.   OBJECTIVE:  VS:   HT:6\' 2"  (188 cm)   WT:190 lb (86.183 kg)  BMI:24.4          BP:123/83 mmHg  HR: bpm  TEMP: ( )  RESP:   PHYSICAL EXAM: GENERAL: adult Caucasian male. In no discomfort; no respiratory distress   PSYCH: alert and appropriate, good insight   NEUROMSK: sensation is intact to light touch in bilateral lower extremities.  Difficulty relaxing.  Hip abduction strength TFL predominant and 5+/5   VASCULAR: No significant edema.    OSTEOPATHIC Exam: LEG LENGTH: Slight pseudo-short right leg       POSTURE: Anterior chain dominant  SOMATIC DYSFUNCTION           Lumbar: L3 rotated right, right upper pole L5 tender point and right L3 tender point           Sacrum: Left on left               Pelvis: Right anterior rotation                  LE: functional Limited external rotation of right hip   ASSESSMENT: 1. Sacroiliac joint dysfunction of right side   2. Somatic dysfunction of pelvis region   3. Somatic dysfunction of lumbar region   4. Somatic dysfunction of sacral region    PROCEDURE NOTE : Osteopathic Manipulation. The decision today to treat with OMT was based on physical exam. Verbal consent was obtained after after explanation of risks, benefits and potential side effects, including acute pain flare, post manipulation soreness and need for repeat treatments.             Regions treated:  Lumbar, Pelvis, Thoracic          Techniques used:  Counterstrain, myofascial  release, articulatory A patient tolerated procedure well with reported improvement in symptoms. Patient given medications, exercises, stretches and lifestyle modifications per AVS and verbally.    PLAN: See problem based charting & AVS for additional documentation.  Treatment as above.   We discussed using less aggressive treatment modalities and used indirect techniques today. Discussed that some exacerbations intermittently with changes and activity are to be expected and reviewed home exercises today.  HEP: Hip abduction strengthening with glute medius focus, right psoas stretch > No Follow-up on file.

## 2020-03-25 ENCOUNTER — Other Ambulatory Visit: Payer: Self-pay | Admitting: Family

## 2020-03-25 DIAGNOSIS — D500 Iron deficiency anemia secondary to blood loss (chronic): Secondary | ICD-10-CM

## 2020-03-25 DIAGNOSIS — D45 Polycythemia vera: Secondary | ICD-10-CM

## 2020-03-26 ENCOUNTER — Inpatient Hospital Stay: Payer: Medicare Other

## 2020-03-26 ENCOUNTER — Encounter: Payer: Self-pay | Admitting: Family

## 2020-03-26 ENCOUNTER — Other Ambulatory Visit: Payer: Self-pay

## 2020-03-26 ENCOUNTER — Other Ambulatory Visit: Payer: Self-pay | Admitting: Family

## 2020-03-26 ENCOUNTER — Inpatient Hospital Stay: Payer: Medicare Other | Attending: Family | Admitting: Family

## 2020-03-26 VITALS — BP 154/79 | HR 67 | Temp 98.3°F | Resp 17

## 2020-03-26 DIAGNOSIS — D45 Polycythemia vera: Secondary | ICD-10-CM | POA: Diagnosis not present

## 2020-03-26 DIAGNOSIS — Z7982 Long term (current) use of aspirin: Secondary | ICD-10-CM | POA: Diagnosis not present

## 2020-03-26 DIAGNOSIS — Z1589 Genetic susceptibility to other disease: Secondary | ICD-10-CM

## 2020-03-26 DIAGNOSIS — D469 Myelodysplastic syndrome, unspecified: Secondary | ICD-10-CM

## 2020-03-26 DIAGNOSIS — D500 Iron deficiency anemia secondary to blood loss (chronic): Secondary | ICD-10-CM

## 2020-03-26 LAB — CBC WITH DIFFERENTIAL (CANCER CENTER ONLY)
Abs Immature Granulocytes: 0.17 10*3/uL — ABNORMAL HIGH (ref 0.00–0.07)
Basophils Absolute: 0.3 10*3/uL — ABNORMAL HIGH (ref 0.0–0.1)
Basophils Relative: 3 %
Eosinophils Absolute: 0.4 10*3/uL (ref 0.0–0.5)
Eosinophils Relative: 3 %
HCT: 45.9 % (ref 39.0–52.0)
Hemoglobin: 13.2 g/dL (ref 13.0–17.0)
Immature Granulocytes: 2 %
Lymphocytes Relative: 13 %
Lymphs Abs: 1.4 10*3/uL (ref 0.7–4.0)
MCH: 19 pg — ABNORMAL LOW (ref 26.0–34.0)
MCHC: 28.8 g/dL — ABNORMAL LOW (ref 30.0–36.0)
MCV: 66 fL — ABNORMAL LOW (ref 80.0–100.0)
Monocytes Absolute: 0.5 10*3/uL (ref 0.1–1.0)
Monocytes Relative: 5 %
Neutro Abs: 8 10*3/uL — ABNORMAL HIGH (ref 1.7–7.7)
Neutrophils Relative %: 74 %
Platelet Count: 623 10*3/uL — ABNORMAL HIGH (ref 150–400)
RBC: 6.95 MIL/uL — ABNORMAL HIGH (ref 4.22–5.81)
RDW: 20.5 % — ABNORMAL HIGH (ref 11.5–15.5)
WBC Count: 10.7 10*3/uL — ABNORMAL HIGH (ref 4.0–10.5)
nRBC: 0 % (ref 0.0–0.2)

## 2020-03-26 LAB — CMP (CANCER CENTER ONLY)
ALT: 18 U/L (ref 0–44)
AST: 22 U/L (ref 15–41)
Albumin: 4.5 g/dL (ref 3.5–5.0)
Alkaline Phosphatase: 81 U/L (ref 38–126)
Anion gap: 8 (ref 5–15)
BUN: 19 mg/dL (ref 8–23)
CO2: 28 mmol/L (ref 22–32)
Calcium: 9.7 mg/dL (ref 8.9–10.3)
Chloride: 104 mmol/L (ref 98–111)
Creatinine: 1.16 mg/dL (ref 0.61–1.24)
GFR, Est AFR Am: >60 mL/min (ref >60)
GFR, Estimated: >60 mL/min (ref >60)
Glucose, Bld: 96 mg/dL (ref 70–99)
Potassium: 4.4 mmol/L (ref 3.5–5.1)
Sodium: 140 mmol/L (ref 135–145)
Total Bilirubin: 0.4 mg/dL (ref 0.3–1.2)
Total Protein: 6.7 g/dL (ref 6.5–8.1)

## 2020-03-26 LAB — SAVE SMEAR(SSMR), FOR PROVIDER SLIDE REVIEW

## 2020-03-26 LAB — RETICULOCYTES
Immature Retic Fract: 11 % (ref 2.3–15.9)
RBC.: 6.75 MIL/uL — ABNORMAL HIGH (ref 4.22–5.81)
Retic Count, Absolute: 77.5 10*3/uL (ref 19.0–186.0)
Retic Ct Pct: 1.2 % (ref 0.4–3.1)

## 2020-03-26 NOTE — Progress Notes (Signed)
Alejandro Ward presents today for phlebotomy per MD orders. Phlebotomy procedure started at [place]  and ended at. 1615 cc removed via 16 G needle at [facility] site when the blood flow stopped. 300 cc were removed. A second attempt was made at [facility] site, started at 1630 and ended at 1640 and another 200 cc were collected. Patient refused to wait 30 minutes post phlebotomy and  tolerated procedure well.  IV needle removed intact. Patient released stable and ASX.

## 2020-03-26 NOTE — Patient Instructions (Signed)

## 2020-03-26 NOTE — Progress Notes (Signed)
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 He was diagnosed about a year and a half ago while living in [place]. He had his annual wellness visit with lab work and his Hgb was 26 at that time and Hct in the 60's. Amazingly enough he was asymptomatic at that time. He was treated with phlebotomies and his counts have come down quite nicely.  He states that he typically has labs every 3 weeks (phlebotomy at that time if Hct is > 45%) and follow-up every 6 weeks.  He has had 2 phlebotomies this year and states that the most recent one was in April or May.  He has opted to hold off on starting Hydrea due to the list of possible side effects.  He is currently taking 1 baby aspirin daily.  He has not had a bone marrow biopsy yet but would like to get this scheduled.  He has a tele visit coming up with Dr. Leonard Schwartz with [facility] in [facility] [place] for a second opinion.  He states that his paternal grandfather passed suddenly from a heart attack in his 65's and his father also had history of quintuple bypass.  No personal history of diabetes or thyroid disease.  He states that his only surgery was a tonsillectomy as a child.  No fever, chills, n/v, cough, rash, dizziness, SOB, chest pain, palpitations, abdominal pain or changes in bowel or bladder habits.  He had cellulitis of the right lower extremity last year after hitting his shin. He was seen by ortho as well as infectious disease and treated with IV antibiotics at that time. He also had what was felt to cellulitis not long after that in the right hand and wrist and was treated again with  antibiotics.  No swelling, tenderness, numbness or tingling in his extremities at this time.  No falls or syncope.  He has maintained a good appetite and is staying well hydrated. His weight is described as stable.  He does not smoke or use recreational drugs. He has the occasional glass of wine once in a while.  He walks regularly for exercise.  He has had both Covid vaccines.  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  ROS: All other 10 point review of systems is negative.   PAST MEDICAL HISTORY:   No past medical history on file.  ALLERGIES: No Known Allergies    MEDICATIONS:  No current outpatient medications on file prior to visit.   No current facility-administered medications on file prior to visit.     PAST SURGICAL HISTORY Past Surgical History:  Procedure Laterality Date  . NOSE SURGERY     broke as a child in football    FAMILY HISTORY: Family History  Problem Relation Age of Onset  . Diabetes Mother   . Hypertension Mother   . Heart attack Father   . Hyperlipidemia Neg Hx   . Sudden death Neg Hx     SOCIAL HISTORY:  reports that he has never smoked. He does not have any smokeless tobacco history on file. No history on file for alcohol use and drug use.  PERFORMANCE STATUS: The patient's performance status is 1 - Symptomatic but completely ambulatory  PHYSICAL EXAM: Most Recent Vital Signs: There were no vitals taken for this visit. BP (!) 154/79 (BP Location: Right Arm, Patient Position: Sitting)   Pulse 67   Temp 98.3 F (36.8 C) (Oral)   Resp 17   SpO2 100%   General Appearance:    Alert, cooperative, no distress, appears stated age  Head:    Normocephalic, without obvious abnormality, atraumatic  Eyes:    PERRL, conjunctiva/corneas clear, EOM's intact, fundi    benign, both eyes             Throat:   Lips, mucosa, and tongue normal; teeth and gums normal  Neck:   Supple, symmetrical, trachea midline, no adenopathy;       thyroid:  No  enlargement/tenderness/nodules; no carotid   bruit or JVD  Back:     Symmetric, no curvature, ROM normal, no CVA tenderness  Lungs:     Clear to auscultation bilaterally, respirations unlabored  Chest wall:    No tenderness or deformity  Heart:    Regular rate and rhythm, S1 and S2 normal, no murmur, rub   or gallop  Abdomen:     Soft, non-tender, bowel sounds active all four quadrants,    no masses, no organomegaly        Extremities:   Extremities normal, atraumatic, no cyanosis or edema  Pulses:   2+ and symmetric all extremities  Skin:   Skin color, texture, turgor normal, no rashes or lesions  Lymph nodes:   Cervical, supraclavicular, and axillary nodes normal  Neurologic:   CNII-XII intact. Normal strength, sensation and reflexes      throughout    LABORATORY DATA:  Results for orders placed or performed in visit on 03/26/20 (from the past 48 hour(s))  CBC with Differential ([place] Only)     Status: Abnormal   Collection Time: 03/26/20  2:18 PM  Result Value Ref Range   WBC Count 10.7 (H) 4.0 - 10.5 K/uL   RBC 6.95 (H) 4.22 - 5.81 MIL/uL   Hemoglobin 13.2 13.0 - 17.0 g/dL   HCT 45.9 39 - 52 %   MCV 66.0 (L) 80.0 - 100.0 fL   MCH 19.0 (L) 26.0 - 34.0 pg   MCHC 28.8 (L) 30.0 - 36.0 g/dL   RDW 20.5 (H) 11.5 - 15.5 %   Platelet Count 623 (H) 150 - 400 K/uL   nRBC 0.0 0.0 - 0.2 %   Neutrophils Relative % 74 %   Neutro Abs 8.0 (H) 1.7 - 7.7 K/uL   Lymphocytes Relative 13 %   Lymphs Abs 1.4 0.7 - 4.0 K/uL   Monocytes Relative 5 %   Monocytes Absolute 0.5 0 - 1 K/uL   Eosinophils Relative 3 %   Eosinophils Absolute 0.4 0 - 0 K/uL   Basophils Relative 3 %   Basophils Absolute 0.3 (H) 0 - 0 K/uL   Immature Granulocytes 2 %   Abs Immature Granulocytes 0.17 (H) 0.00 - 0.07 K/uL    Comment: Performed at [facility] at [facility], [address], [place], [place] [address]  CMP ([place] only)     Status: None   Collection Time: 03/26/20   2:18 PM  Result Value Ref Range   Sodium 140 135 - 145 mmol/L   Potassium 4.4 3.5 - 5.1 mmol/L   Chloride 104 98 - 111 mmol/L  CO2 28 22 - 32 mmol/L   Glucose, Bld 96 70 - 99 mg/dL    Comment: Glucose reference range applies only to samples taken after fasting for at least 8 hours.   BUN 19 8 - 23 mg/dL   Creatinine 1.16 0.61 - 1.24 mg/dL   Calcium 9.7 8.9 - 10.3 mg/dL   Total Protein 6.7 6.5 - 8.1 g/dL   Albumin 4.5 3.5 - 5.0 g/dL   AST 22 15 - 41 U/L   ALT 18 0 - 44 U/L   Alkaline Phosphatase 81 38 - 126 U/L   Total Bilirubin 0.4 0.3 - 1.2 mg/dL   GFR, Est Non Af Am >60 >60 mL/min   GFR, Est AFR Am >60 >60 mL/min   Anion gap 8 5 - 15    Comment: Performed at [facility] at [facility], [address], [place], [place] [address]  Save Smear [facility])     Status: None   Collection Time: 03/26/20  2:18 PM  Result Value Ref Range   Smear Review SMEAR STAINED AND AVAILABLE FOR REVIEW     Comment: Performed at [facility] at [place], [address] [place], [place] [address]  Reticulocytes     Status: Abnormal   Collection Time: 03/26/20  2:19 PM  Result Value Ref Range   Retic Ct Pct 1.2 0.4 - 3.1 %   RBC. 6.75 (H) 4.22 - 5.81 MIL/uL   Retic Count, Absolute 77.5 19.0 - 186.0 K/uL   Immature Retic Fract 11.0 2.3 - 15.9 %    Comment: Performed at [facility] at [facility], [address] [place], [place] [address]      RADIOGRAPHY: No results found.     PATHOLOGY: None  ASSESSMENT/PLAN: Mr. Norberto is a very pleasant 69 yo caucasian gentleman with polycythemia vera, JAK 2 positive.  He has responded nicely to phlebotomies and will have one today for Hct of 45.9%.  He will continue his daily baby aspirin.  We will get him set up for bone marrow biopsy within the next couple of weeks.  He will go for lab and phlebotomy (if needed) at [facility] in 3 weeks and follow-up with [place] again in 6 weeks.    All questions were answered and they are in agreement with the plan. He Ward contact our office with any questions or concerns.  He was discussed with and also seen by Dr. Marin Olp and he is in agreement with the aforementioned.   Laverna Peace, NP  ADDENDUM: I saw and examined Mr. Brander with [facility].  He and his wife are incredibly interesting and very nice.  He is originally from [place].  They have lived in different parts of the country.  He has a longstanding diagnosis of polycythemia vera.  He tested positive for the JAK2 gene mutation.  He has been getting phlebotomies.  He had phlebotomies in [place].  He recently moved to [facility].  He has been in [place] for about 6 weeks.  I did look at his blood smear.  He has no nucleated red blood cells.  He has mild anisocytosis and poikilocytosis.  He has mature polys.  I do not see any hypersegmented polys.  Platelets are increased in number and size.  He has several large platelets.  He has yet to have a bone marrow biopsy.  This might be something that we need  to do.  We know that he has the JAK2 assay.  However, we Ward certainly see if he has other chromosomal abnormalities.  He is on aspirin.  He was offered Hydrea in the past but declined this secondary to fear of side effects..  I told him that I would probably utilize Hydrea over [place].  I realize that Jakafi probably would be highly effective on him.  However there might be toxicity with Jakafi.  He is going to have a telephone conference with one of the liters of myeloproliferative disorders in the country -Dr. Leonard Schwartz.  This Ward be in September.  We will go ahead and phlebotomize him today.  This is just to keep his hematocrit below 45%.  He is iron deficient.  Again he needs to be iron deficient secondary to his polycythemia.  I told he and his wife not to take any iron supplements.  Again he is very interesting.  It was fun talking to he and his  wife.  We will plan to get him back in late September after he has his telephone consultation.  We spent about 45 minutes with he and his wife.  Lattie Haw, MD

## 2020-03-27 ENCOUNTER — Telehealth: Payer: Self-pay | Admitting: Family

## 2020-03-27 ENCOUNTER — Other Ambulatory Visit (HOSPITAL_COMMUNITY): Payer: Medicare Other

## 2020-03-27 LAB — IRON AND TIBC
Iron: 19 ug/dL — ABNORMAL LOW (ref 42–163)
Saturation Ratios: 5 % — ABNORMAL LOW (ref 20–55)
TIBC: 405 ug/dL (ref 202–409)
UIBC: 386 ug/dL — ABNORMAL HIGH (ref 117–376)

## 2020-03-27 LAB — FERRITIN: Ferritin: 6 ng/mL — ABNORMAL LOW (ref 24–336)

## 2020-03-27 LAB — LACTATE DEHYDROGENASE: LDH: 217 U/L — ABNORMAL HIGH (ref 98–192)

## 2020-03-27 NOTE — Telephone Encounter (Signed)
Appointments scheduled calendar printed & mailed per 8/24 los

## 2020-03-28 LAB — ERYTHROPOIETIN: Erythropoietin: 2.3 m[IU]/mL — ABNORMAL LOW (ref 2.6–18.5)

## 2020-04-02 ENCOUNTER — Other Ambulatory Visit: Payer: Self-pay | Admitting: Radiology

## 2020-04-04 ENCOUNTER — Other Ambulatory Visit: Payer: Self-pay

## 2020-04-04 ENCOUNTER — Ambulatory Visit (HOSPITAL_COMMUNITY)
Admission: RE | Admit: 2020-04-04 | Discharge: 2020-04-04 | Disposition: A | Discharge status: Home / Self Care | Payer: Medicare Other | Source: Ambulatory Visit | Attending: Family | Admitting: Family

## 2020-04-04 ENCOUNTER — Other Ambulatory Visit (HOSPITAL_COMMUNITY): Payer: Medicare Other

## 2020-04-04 ENCOUNTER — Encounter (HOSPITAL_COMMUNITY): Payer: Self-pay

## 2020-04-04 DIAGNOSIS — Z833 Family history of diabetes mellitus: Secondary | ICD-10-CM | POA: Insufficient documentation

## 2020-04-04 DIAGNOSIS — D45 Polycythemia vera: Secondary | ICD-10-CM | POA: Diagnosis not present

## 2020-04-04 DIAGNOSIS — Z8349 Family history of other endocrine, nutritional and metabolic diseases: Secondary | ICD-10-CM | POA: Diagnosis not present

## 2020-04-04 DIAGNOSIS — Z7982 Long term (current) use of aspirin: Secondary | ICD-10-CM | POA: Insufficient documentation

## 2020-04-04 DIAGNOSIS — D469 Myelodysplastic syndrome, unspecified: Secondary | ICD-10-CM | POA: Diagnosis present

## 2020-04-04 DIAGNOSIS — Z1589 Genetic susceptibility to other disease: Secondary | ICD-10-CM | POA: Insufficient documentation

## 2020-04-04 DIAGNOSIS — D473 Essential (hemorrhagic) thrombocythemia: Secondary | ICD-10-CM | POA: Diagnosis not present

## 2020-04-04 DIAGNOSIS — Z8249 Family history of ischemic heart disease and other diseases of the circulatory system: Secondary | ICD-10-CM | POA: Diagnosis not present

## 2020-04-04 LAB — CBC WITH DIFFERENTIAL/PLATELET
Abs Immature Granulocytes: 0.05 10*3/uL (ref 0.00–0.07)
Basophils Absolute: 0.2 10*3/uL — ABNORMAL HIGH (ref 0.0–0.1)
Basophils Relative: 3 %
Eosinophils Absolute: 0.3 10*3/uL (ref 0.0–0.5)
Eosinophils Relative: 4 %
HCT: 45.6 % (ref 39.0–52.0)
Hemoglobin: 12.7 g/dL — ABNORMAL LOW (ref 13.0–17.0)
Immature Granulocytes: 1 %
Lymphocytes Relative: 13 %
Lymphs Abs: 1.1 10*3/uL (ref 0.7–4.0)
MCH: 18.9 pg — ABNORMAL LOW (ref 26.0–34.0)
MCHC: 27.9 g/dL — ABNORMAL LOW (ref 30.0–36.0)
MCV: 68 fL — ABNORMAL LOW (ref 80.0–100.0)
Monocytes Absolute: 0.5 10*3/uL (ref 0.1–1.0)
Monocytes Relative: 5 %
Neutro Abs: 6.3 10*3/uL (ref 1.7–7.7)
Neutrophils Relative %: 74 %
Platelets: 581 10*3/uL — ABNORMAL HIGH (ref 150–400)
RBC: 6.71 MIL/uL — ABNORMAL HIGH (ref 4.22–5.81)
RDW: 20.7 % — ABNORMAL HIGH (ref 11.5–15.5)
WBC: 8.5 10*3/uL (ref 4.0–10.5)
nRBC: 0 % (ref 0.0–0.2)

## 2020-04-04 LAB — PROTIME-INR
INR: 1.1 (ref 0.8–1.2)
Prothrombin Time: 13.8 seconds (ref 11.4–15.2)

## 2020-04-04 MED ORDER — MIDAZOLAM HCL 2 MG/2ML IJ SOLN
INTRAMUSCULAR | Status: AC
  Filled 2020-04-04: qty 4

## 2020-04-04 MED ORDER — FENTANYL CITRATE (PF) 100 MCG/2ML IJ SOLN
INTRAMUSCULAR | Status: AC
  Filled 2020-04-04: qty 4

## 2020-04-04 MED ORDER — SODIUM CHLORIDE 0.9 % IV SOLN: INTRAVENOUS | Status: DC

## 2020-04-04 MED ORDER — MIDAZOLAM HCL 2 MG/2ML IJ SOLN
INTRAMUSCULAR | Status: AC | PRN
  Administered 2020-04-04 (×2): 1 mg via INTRAVENOUS

## 2020-04-04 MED ORDER — FENTANYL CITRATE (PF) 100 MCG/2ML IJ SOLN
INTRAMUSCULAR | Status: AC | PRN
  Administered 2020-04-04: 50 ug via INTRAVENOUS

## 2020-04-04 NOTE — Procedures (Signed)
Interventional Radiology Procedure Note  Procedure: CT guided bone marrow aspiration and biopsy  Complications: None  EBL: < 10 mL  Findings: Aspirate and core biopsy performed of bone marrow in right iliac bone.  Plan: Bedrest supine x 1 hrs  Livie Vanderhoof T. Mckinze Poirier, M.D Pager:  [phone]   

## 2020-04-04 NOTE — Discharge Instructions (Addendum)
DO NOT SHOWER X 24 HRS  FOR ANY QUESTIONS OR CONCERNS CALL [phone]  Bone Marrow Aspiration and Bone Marrow Biopsy, Adult, Care After This sheet gives you information about how to care for yourself after your procedure. Your health care provider may also give you more specific instructions. If you have problems or questions, contact your health care provider. What can I expect after the procedure? After the procedure, it is common to have:  Mild pain and tenderness.  Swelling.  Bruising. Follow these instructions at home: Puncture site care   Follow instructions from your health care provider about how to take care of the puncture site. Make sure you: ? Wash your hands with soap and water before and after you change your bandage (dressing). If soap and water are not available, use hand sanitizer. ? Change your dressing as told by your health care provider.  Check your puncture site every day for signs of infection. Check for: ? More redness, swelling, or pain. ? Fluid or blood. ? Warmth. ? Pus or a bad smell. Activity  Return to your normal activities as told by your health care provider. Ask your health care provider what activities are safe for you.  Do not lift anything that is heavier than 10 lb (4.5 kg), or the limit that you are told, until your health care provider says that it is safe.  Do not drive for 24 hours if you were given a sedative during your procedure. General instructions   Take over-the-counter and prescription medicines only as told by your health care provider.  Do not take baths, swim, or use a hot tub until your health care provider approves. Ask your health care provider if you may take showers. You may only be allowed to take sponge baths.  If directed, put ice on the affected area. To do this: ? Put ice in a plastic bag. ? Place a towel between your skin and the bag. ? Leave the ice on for 20 minutes, 2-3 times a day.  Keep all follow-up  visits as told by your health care provider. This is important. Contact a health care provider if:  Your pain is not controlled with medicine.  You have a fever.  You have more redness, swelling, or pain around the puncture site.  You have fluid or blood coming from the puncture site.  Your puncture site feels warm to the touch.  You have pus or a bad smell coming from the puncture site. Summary  After the procedure, it is common to have mild pain, tenderness, swelling, and bruising.  Follow instructions from your health care provider about how to take care of the puncture site and what activities are safe for you.  Take over-the-counter and prescription medicines only as told by your health care provider.  Contact a health care provider if you have any signs of infection, such as fluid or blood coming from the puncture site. This information is not intended to replace advice given to you by your health care provider. Make sure you discuss any questions you have with your health care provider. Document Revised: 12/06/2018 Document Reviewed: 12/06/2018 Elsevier Patient Education  [place]. Moderate Conscious Sedation, Adult, Care After These instructions provide you with information about caring for yourself after your procedure. Your health care provider may also give you more specific instructions. Your treatment has been planned according to current medical practices, but problems sometimes occur. Call your health care provider if you have any problems  or questions after your procedure. What can I expect after the procedure? After your procedure, it is common:  To feel sleepy for several hours.  To feel clumsy and have poor balance for several hours.  To have poor judgment for several hours.  To vomit if you eat too soon. Follow these instructions at home: For at least 24 hours after the procedure:   Do not: ? Participate in activities where you could fall or  become injured. ? Drive. ? Use heavy machinery. ? Drink alcohol. ? Take sleeping pills or medicines that cause drowsiness. ? Make important decisions or sign legal documents. ? Take care of children on your own.  Rest. Eating and drinking  Follow the diet recommended by your health care provider.  If you vomit: ? Drink water, juice, or soup when you can drink without vomiting. ? Make sure you have little or no nausea before eating solid foods. General instructions  Have a responsible adult stay with you until you are awake and alert.  Take over-the-counter and prescription medicines only as told by your health care provider.  If you smoke, do not smoke without supervision.  Keep all follow-up visits as told by your health care provider. This is important. Contact a health care provider if:  You keep feeling nauseous or you keep vomiting.  You feel light-headed.  You develop a rash.  You have a fever. Get help right away if:  You have trouble breathing. This information is not intended to replace advice given to you by your health care provider. Make sure you discuss any questions you have with your health care provider. Document Revised: 07/02/2017 Document Reviewed: 11/09/2015 Elsevier Patient Education  2020 Elsevier Inc.  

## 2020-04-04 NOTE — H&P (Signed)
 Chief Complaint: Patient was seen in consultation today for polycythemia vera/bone marrow biopsy and aspiration.  Referring Physician(s): Ward,Alejandro M  Supervising Physician: Ward, Alejandro  Patient Status: [facility] - Out-pt  History of Present Illness: Alejandro Ward is a 69 y.o. male with a past medical history of polycythemia vera. 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 His cancer was managed in [place], but he recently moved to [place], where is cancer is currently managed by Alejandro [place], NP and Dr. Ennever.   IR requested by Alejandro [place], NP for possible image-guided bone marrow biopsy/aspiration. Patient awake and alert sitting in bed with no complaints at this time. Accompanied by wife, Alejandro Ward, via speaker phone. Denies fever, chills, chest pain, dyspnea, abdominal pain, or headache.   History reviewed. No pertinent past medical history.  Past Surgical History:  Procedure Laterality Date  . NOSE SURGERY     broke as a child in football    Allergies: Patient has no known allergies.  Medications: Prior to Admission medications   Medication Sig Start Date End Date Taking? Authorizing Provider  aspirin 81 MG EC tablet Take by mouth.   Yes [provider]     Family History  Problem Relation Age of Onset  . Diabetes Mother   . Hypertension Mother   . Heart attack Father   . Hyperlipidemia Neg Hx   . Sudden death Neg Hx     Social History   Socioeconomic History  . Marital status: Married    Spouse name: Not on file  . Number of children: Not on file  . Years of education: Not on file  . Highest education level: Not on file  Occupational History  . Not on file  Tobacco Use  . Smoking status: Never Smoker  . Smokeless tobacco: Never Used  Vaping Use  . Vaping Use: Never used  Substance and Sexual Activity  . Alcohol use: Not on file  . Drug use: Not on  file  . Sexual activity: Not on file  Other Topics Concern  . Not on file  Social History Narrative  . Not on file   Social Determinants of Health   Financial Resource Strain:   . Difficulty of Paying Living Expenses: Not on file  Food Insecurity:   . Worried About Running Out of Food in the Last Year: Not on file  . Ran Out of Food in the Last Year: Not on file  Transportation Needs:   . Lack of Transportation (Medical): Not on file  . Lack of Transportation (Non-Medical): Not on file  Physical Activity:   . Days of Exercise per Week: Not on file  . Minutes of Exercise per Session: Not on file  Stress:   . Feeling of Stress : Not on file  Social Connections:   . Frequency of Communication with Friends and Family: Not on file  . Frequency of Social Gatherings with Friends and Family: Not on file  . Attends Religious Services: Not on file  . Active Member of Clubs or Organizations: Not on file  . Attends Club or Organization Meetings: Not on file  . Marital Status: Not on file     Review of Systems: A 12 point ROS discussed and pertinent positives are indicated in the HPI above.  All other systems are negative.  Review of Systems  Constitutional: Negative for chills and fever.  Respiratory: Negative for shortness of breath and   wheezing.   Cardiovascular: Negative for chest pain and palpitations.  Gastrointestinal: Negative for abdominal pain.  Neurological: Negative for headaches.  Psychiatric/Behavioral: Negative for behavioral problems and confusion.    Vital Signs: BP (!) 164/84   Pulse 63   Temp 98.4 F (36.9 C) (Oral)   Resp 18   SpO2 99%   Physical Exam Vitals and nursing note reviewed.  Constitutional:      General: He is not in acute distress.    Appearance: Normal appearance.  Cardiovascular:     Rate and Rhythm: Normal rate and regular rhythm.     Heart sounds: Normal heart sounds. No murmur heard.   Pulmonary:     Effort: Pulmonary effort is  normal. No respiratory distress.     Breath sounds: Normal breath sounds. No wheezing.  Skin:    General: Skin is warm and dry.  Neurological:     Mental Status: He is alert and oriented to person, place, and time.      MD Evaluation Airway: WNL Heart: WNL Abdomen: WNL Chest/ Lungs: WNL ASA  Classification: 3 Mallampati/Airway Score: Two   Imaging: No results found.  Labs:  CBC: Recent Labs    03/26/20 1418  WBC 10.7*  HGB 13.2  HCT 45.9  PLT 623*    COAGS: No results for input(s): INR, APTT in the last 8760 hours.  BMP: Recent Labs    03/26/20 1418  NA 140  K 4.4  CL 104  CO2 28  GLUCOSE 96  BUN 19  CALCIUM 9.7  CREATININE 1.16  GFRNONAA >60  GFRAA >60    LIVER FUNCTION TESTS: Recent Labs    03/26/20 1418  BILITOT 0.4  AST 22  ALT 18  ALKPHOS 81  PROT 6.7  ALBUMIN 4.5     Assessment and Plan:  Polycythemia vera, JACK 2 positive. Plan for image-guided bone marrow biopsy/aspiration today in IR. Patient is NPO. Afebrile.  Risks and benefits discussed with the patient including, but not limited to bleeding, infection, damage to adjacent structures or low yield requiring additional tests. All of the patient's questions were answered, patient is agreeable to proceed. Consent signed and in chart.   Thank you for this interesting consult.  I greatly enjoyed meeting Alejandro Ward and look forward to participating in their care.  A copy of this report was sent to the requesting provider on this date.  Electronically Signed: Alexandra Louk, PA-C 04/04/2020, 8:36 AM   I spent a total of 30 Minutes in face to face in clinical consultation, greater than 50% of which was counseling/coordinating care for polycythemia vera/bone marrow biopsy and aspiration. 

## 2020-04-10 ENCOUNTER — Telehealth: Payer: Self-pay | Admitting: Family

## 2020-04-10 NOTE — Telephone Encounter (Signed)
Left a message with call back number to go over recent bone marrow biopsy result.

## 2020-04-10 NOTE — Telephone Encounter (Signed)
I was able to speak with both Alejandro Ward and his wife over the phone and go over his recent bone marrow biopsy result in detail which confirmed PV diagnosis. He is not ready to start treatment with Hydrea at this time.  We will continue to check labs every 3 weeks and do a phlebotomy as needed.  He has an appointment with Dr. Leonard Schwartz coming up. Bone marrow biopsy result was faxed to Dr. Chauncy Passy today.

## 2020-04-11 ENCOUNTER — Encounter (HOSPITAL_COMMUNITY): Payer: Self-pay | Admitting: Hematology & Oncology

## 2020-04-11 LAB — SURGICAL PATHOLOGY

## 2020-04-15 ENCOUNTER — Encounter: Payer: Self-pay | Admitting: *Deleted

## 2020-04-16 ENCOUNTER — Other Ambulatory Visit: Payer: Self-pay

## 2020-04-16 ENCOUNTER — Inpatient Hospital Stay: Payer: Medicare Other

## 2020-04-16 ENCOUNTER — Inpatient Hospital Stay: Payer: Medicare Other | Attending: Family

## 2020-04-16 DIAGNOSIS — D45 Polycythemia vera: Secondary | ICD-10-CM | POA: Diagnosis not present

## 2020-04-16 LAB — CBC WITH DIFFERENTIAL (CANCER CENTER ONLY)
Abs Immature Granulocytes: 0.04 10*3/uL (ref 0.00–0.07)
Basophils Absolute: 0.3 10*3/uL — ABNORMAL HIGH (ref 0.0–0.1)
Basophils Relative: 3 %
Eosinophils Absolute: 0.4 10*3/uL (ref 0.0–0.5)
Eosinophils Relative: 4 %
HCT: 45 % (ref 39.0–52.0)
Hemoglobin: 12.8 g/dL — ABNORMAL LOW (ref 13.0–17.0)
Immature Granulocytes: 0 %
Lymphocytes Relative: 14 %
Lymphs Abs: 1.4 10*3/uL (ref 0.7–4.0)
MCH: 18.7 pg — ABNORMAL LOW (ref 26.0–34.0)
MCHC: 28.4 g/dL — ABNORMAL LOW (ref 30.0–36.0)
MCV: 65.7 fL — ABNORMAL LOW (ref 80.0–100.0)
Monocytes Absolute: 0.5 10*3/uL (ref 0.1–1.0)
Monocytes Relative: 5 %
Neutro Abs: 7.5 10*3/uL (ref 1.7–7.7)
Neutrophils Relative %: 74 %
Platelet Count: 621 10*3/uL — ABNORMAL HIGH (ref 150–400)
RBC: 6.85 MIL/uL — ABNORMAL HIGH (ref 4.22–5.81)
RDW: 20.6 % — ABNORMAL HIGH (ref 11.5–15.5)
WBC Count: 10 10*3/uL (ref 4.0–10.5)
nRBC: 0 % (ref 0.0–0.2)

## 2020-04-16 LAB — CMP (CANCER CENTER ONLY)
ALT: 15 U/L (ref 0–44)
AST: 19 U/L (ref 15–41)
Albumin: 3.9 g/dL (ref 3.5–5.0)
Alkaline Phosphatase: 81 U/L (ref 38–126)
Anion gap: 7 (ref 5–15)
BUN: 17 mg/dL (ref 8–23)
CO2: 26 mmol/L (ref 22–32)
Calcium: 9.1 mg/dL (ref 8.9–10.3)
Chloride: 106 mmol/L (ref 98–111)
Creatinine: 1.17 mg/dL (ref 0.61–1.24)
GFR, Est AFR Am: >60 mL/min (ref >60)
GFR, Estimated: >60 mL/min (ref >60)
Glucose, Bld: 100 mg/dL — ABNORMAL HIGH (ref 70–99)
Potassium: 4.3 mmol/L (ref 3.5–5.1)
Sodium: 139 mmol/L (ref 135–145)
Total Bilirubin: 0.4 mg/dL (ref 0.3–1.2)
Total Protein: 6.5 g/dL (ref 6.5–8.1)

## 2020-04-16 NOTE — Progress Notes (Signed)
No phlebotomy today per Arnell Sieving, RN.  Spoke with pt in [employer], he verbalizes understanding and agrees with plan of care

## 2020-04-18 ENCOUNTER — Other Ambulatory Visit: Payer: Self-pay

## 2020-04-19 ENCOUNTER — Ambulatory Visit (INDEPENDENT_AMBULATORY_CARE_PROVIDER_SITE_OTHER): Payer: Medicare Other | Admitting: Internal Medicine

## 2020-04-19 ENCOUNTER — Encounter: Payer: Self-pay | Admitting: Internal Medicine

## 2020-04-19 VITALS — BP 130/80 | HR 61 | Temp 97.9°F | Ht 71.0 in | Wt 187.2 lb

## 2020-04-19 DIAGNOSIS — D45 Polycythemia vera: Secondary | ICD-10-CM | POA: Diagnosis not present

## 2020-04-19 DIAGNOSIS — R030 Elevated blood-pressure reading, without diagnosis of hypertension: Secondary | ICD-10-CM

## 2020-04-19 DIAGNOSIS — Z23 Encounter for immunization: Secondary | ICD-10-CM

## 2020-04-19 DIAGNOSIS — Z1211 Encounter for screening for malignant neoplasm of colon: Secondary | ICD-10-CM | POA: Diagnosis not present

## 2020-04-19 NOTE — Progress Notes (Addendum)
New Patient Office Visit     This visit occurred during the SARS-CoV-2 public health emergency.  Safety protocols were in place, including screening questions prior to the visit, additional usage of staff PPE, and extensive cleaning of exam room while observing appropriate contact time as indicated for disinfecting solutions.    CC/Reason for Visit: Establish care, discuss chronic conditions [place]CP[place] Last Visit: Earlier this year  HPI: Alejandro Ward is a 69 y.o. male who is coming in today for the above mentioned reasons. Past Medical History is significant for: Polycythemia vera now followed by Dr. Marin Olp who was treated with as needed phlebotomy sessions.  His last phlebotomy was on 8/24.  This diagnosis was confirmed by recent bone marrow biopsy.  He and his wife recently moved from [place].  He works as an Forensic psychologist.  He does not smoke, he drinks alcohol occasionally, he has no known drug allergy and no past surgical history of significance.  His only medication is 81 mg of aspirin.  He is overdue for his colonoscopy.  He is overdue for flu, pneumonia, Tdap and shingles vaccinations.  He did get his Covid vaccinations in February.  He has noticed throughout the years of elevated blood pressure values.  Today in office his pressure is 130/80, at prior visit was oncology it was 143/60, his wife notes that his pressures have been as high as 150/90 at times.   Past Medical/Surgical History: No past medical history on file.  Past Surgical History:  Procedure Laterality Date  . NOSE SURGERY     broke as a child in football    Social History:  reports that he has never smoked. He has never used smokeless tobacco. No history on file for alcohol use and drug use.  Allergies: No Known Allergies  Family History:  Family History  Problem Relation Age of Onset  . Diabetes Mother   . Hypertension Mother   . Heart attack Father   . Hyperlipidemia Neg Hx   .  Sudden death Neg Hx      Current Outpatient Medications:  .  aspirin 81 MG EC tablet, Take by mouth., Disp: , Rfl:   Review of Systems:  Constitutional: Denies fever, chills, diaphoresis, appetite change and fatigue.  HEENT: Denies photophobia, eye pain, redness, hearing loss, ear pain, congestion, sore throat, rhinorrhea, sneezing, mouth sores, trouble swallowing, neck pain, neck stiffness and tinnitus.   Respiratory: Denies SOB, DOE, cough, chest tightness,  and wheezing.   Cardiovascular: Denies chest pain, palpitations and leg swelling.  Gastrointestinal: Denies nausea, vomiting, abdominal pain, diarrhea, constipation, blood in stool and abdominal distention.  Genitourinary: Denies dysuria, urgency, frequency, hematuria, flank pain and difficulty urinating.  Endocrine: Denies: hot or cold intolerance, sweats, changes in hair or nails, polyuria, polydipsia. Musculoskeletal: Denies myalgias, back pain, joint swelling, arthralgias and gait problem.  Skin: Denies pallor, rash and wound.  Neurological: Denies dizziness, seizures, syncope, weakness, light-headedness, numbness and headaches.  Hematological: Denies adenopathy. Easy bruising, personal or family bleeding history  Psychiatric/Behavioral: Denies suicidal ideation, mood changes, confusion, nervousness, sleep disturbance and agitation    Physical Exam: Vitals:   04/19/20 1453  BP: 130/80  Pulse: 61  Temp: 97.9 F (36.6 C)  TempSrc: Oral  SpO2: 97%  Weight: 187 lb 3.2 oz (84.9 kg)  Height: _0  (1.803 m)   Body mass index is 26.11 kg/m.  Constitutional: NAD, calm, comfortable Eyes: PERRL, lids and conjunctivae normal, wears corrective lenses ENMT: Mucous membranes are  moist. Respiratory: clear to auscultation bilaterally, no wheezing, no crackles. Normal respiratory effort. No accessory muscle use.  Cardiovascular: Regular rate and rhythm, no murmurs / rubs / gallops. No extremity edema.  Abdomen: no tenderness, no  masses palpated. No hepatosplenomegaly. Bowel sounds positive.  Neurologic: Grossly intact and nonfocal Psychiatric: Normal judgment and insight. Alert and oriented x 3. Normal mood.    Impression and Plan:  Polycythemia vera ([place]) -Followed by oncology. -For now treated with as needed phlebotomy, he is giving consideration to starting hydroxyurea.  Elevated BP without diagnosis of hypertension -Advised to do ambulatory blood pressure monitoring and will return in 3 months for follow-up.  Need for influenza vaccination -Flu vaccine administered today.  Need for vaccination against Streptococcus pneumoniae -Pneumovax administered today.  Time spent: 45 minutes  Patient Instructions  -Nice seeing you today!!  -Flu and pneumonia vaccines today.  -Check blood pressure 2-3 times a week and bring measurements into your next visit.  -Schedule follow up in 3 months.     Lelon Frohlich, MD [facility]

## 2020-04-19 NOTE — Patient Instructions (Signed)
-  Nice seeing you today!!  -Flu and pneumonia vaccines today.  -Check blood pressure 2-3 times a week and bring measurements into your next visit.  -Schedule follow up in 3 months.

## 2020-05-07 ENCOUNTER — Inpatient Hospital Stay: Payer: Medicare Other

## 2020-05-07 ENCOUNTER — Ambulatory Visit: Payer: 59 | Admitting: Family

## 2020-05-07 ENCOUNTER — Inpatient Hospital Stay: Payer: Medicare Other | Attending: Family

## 2020-05-07 ENCOUNTER — Other Ambulatory Visit: Payer: 59

## 2020-05-07 ENCOUNTER — Inpatient Hospital Stay (HOSPITAL_BASED_OUTPATIENT_CLINIC_OR_DEPARTMENT_OTHER): Payer: Medicare Other | Admitting: Family

## 2020-05-07 ENCOUNTER — Other Ambulatory Visit: Payer: Self-pay

## 2020-05-07 ENCOUNTER — Other Ambulatory Visit: Payer: Self-pay | Admitting: Family

## 2020-05-07 ENCOUNTER — Encounter: Payer: Self-pay | Admitting: Family

## 2020-05-07 VITALS — BP 136/67 | HR 65 | Temp 97.9°F | Resp 18

## 2020-05-07 VITALS — BP 134/63 | HR 58 | Temp 97.8°F | Resp 18 | Ht 71.0 in | Wt 188.0 lb

## 2020-05-07 DIAGNOSIS — D45 Polycythemia vera: Secondary | ICD-10-CM

## 2020-05-07 LAB — CMP (CANCER CENTER ONLY)
ALT: 16 U/L (ref 0–44)
AST: 21 U/L (ref 15–41)
Albumin: 4.2 g/dL (ref 3.5–5.0)
Alkaline Phosphatase: 77 U/L (ref 38–126)
Anion gap: 6 (ref 5–15)
BUN: 20 mg/dL (ref 8–23)
CO2: 29 mmol/L (ref 22–32)
Calcium: 9.5 mg/dL (ref 8.9–10.3)
Chloride: 104 mmol/L (ref 98–111)
Creatinine: 1.18 mg/dL (ref 0.61–1.24)
GFR, Estimated: >60 mL/min (ref >60)
Glucose, Bld: 105 mg/dL — ABNORMAL HIGH (ref 70–99)
Potassium: 4.3 mmol/L (ref 3.5–5.1)
Sodium: 139 mmol/L (ref 135–145)
Total Bilirubin: 0.4 mg/dL (ref 0.3–1.2)
Total Protein: 6.3 g/dL — ABNORMAL LOW (ref 6.5–8.1)

## 2020-05-07 LAB — CBC WITH DIFFERENTIAL (CANCER CENTER ONLY)
Abs Immature Granulocytes: 0.05 10*3/uL (ref 0.00–0.07)
Basophils Absolute: 0.3 10*3/uL — ABNORMAL HIGH (ref 0.0–0.1)
Basophils Relative: 3 %
Eosinophils Absolute: 0.4 10*3/uL (ref 0.0–0.5)
Eosinophils Relative: 4 %
HCT: 45.2 % (ref 39.0–52.0)
Hemoglobin: 12.8 g/dL — ABNORMAL LOW (ref 13.0–17.0)
Immature Granulocytes: 1 %
Lymphocytes Relative: 12 %
Lymphs Abs: 1.3 10*3/uL (ref 0.7–4.0)
MCH: 19 pg — ABNORMAL LOW (ref 26.0–34.0)
MCHC: 28.3 g/dL — ABNORMAL LOW (ref 30.0–36.0)
MCV: 67.3 fL — ABNORMAL LOW (ref 80.0–100.0)
Monocytes Absolute: 0.5 10*3/uL (ref 0.1–1.0)
Monocytes Relative: 5 %
Neutro Abs: 8 10*3/uL — ABNORMAL HIGH (ref 1.7–7.7)
Neutrophils Relative %: 75 %
Platelet Count: 630 10*3/uL — ABNORMAL HIGH (ref 150–400)
RBC: 6.72 MIL/uL — ABNORMAL HIGH (ref 4.22–5.81)
RDW: 20.3 % — ABNORMAL HIGH (ref 11.5–15.5)
WBC Count: 10.6 10*3/uL — ABNORMAL HIGH (ref 4.0–10.5)
nRBC: 0 % (ref 0.0–0.2)

## 2020-05-07 NOTE — Progress Notes (Addendum)
Hematology and Oncology Follow Up Visit  Alejandro Ward [id number] Mar 08, 1951 69 y.o. 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 He is doing well and has no complaints at this time.  He had a wonderful visit with Alejandro Ward and is feeling a little better about potentially starting Hydrea at some pont.  Hct today is 45.2%. WBC count 10.6 and platelets 630 (previously 621).  No fever, chills, n/v, cough, rash, dizziness, SOB, chest pain, palpitations, abdominal pain or changes in bowel or bladder habits.  No episodes of bleeding. N abnormal bruising or petechiae.  No swelling, tenderness, numbness or tingling in his extremities.  No falls or syncopal episodes to report.  He has maintained a good appetite and is staying well hydrated. His weight is stable at 188 lbs.  He is walking regularly for exercise.   ECOG Performance Status: 1 - Symptomatic but completely ambulatory  Medications:  Allergies as of 05/07/2020   No Known Allergies     Medication List       Accurate as of May 07, 2020  1:31 PM. If you have any questions, ask your nurse or doctor.        aspirin 81 MG EC tablet Take by mouth.       Allergies: No Known Allergies  Past Medical History, Surgical history, Social history, and Family History were reviewed and updated.  Review of Systems: All other 10 point review of systems is negative.   Physical Exam:  vitals were not taken for this visit.   Wt Readings from Last 3 Encounters:  04/19/20 187 lb 3.2 oz (84.9 kg)  07/25/14 190 lb (86.2 kg)  06/22/14 190 lb (86.2 kg)    Ocular: Sclerae unicteric, pupils equal, round and reactive to light Ear-nose-throat: Oropharynx clear, dentition fair Lymphatic: No cervical or supraclavicular  adenopathy Lungs no rales or rhonchi, good excursion bilaterally Heart regular rate and rhythm, no murmur appreciated Abd soft, nontender, positive bowel sounds MSK no focal spinal tenderness, no joint edema Neuro: non-focal, well-oriented, appropriate affect Breasts: Deferred   Lab Results  Component Value Date   WBC 10.6 (H) 05/07/2020   HGB 12.8 (L) 05/07/2020   HCT 45.2 05/07/2020   MCV 67.3 (L) 05/07/2020   PLT 630 (H) 05/07/2020   Lab Results  Component Value Date   FERRITIN 6 (L) 03/26/2020   IRON 19 (L) 03/26/2020   TIBC 405 03/26/2020   UIBC 386 (H) 03/26/2020   IRONPCTSAT 5 (L) 03/26/2020   Lab Results  Component Value Date   RETICCTPCT 1.2 03/26/2020   RBC 6.72 (H) 05/07/2020   No results found for: KPAFRELGTCHN, LAMBDASER, KAPLAMBRATIO No results found for: IGGSERUM, IGA, IGMSERUM No results found for: Ronnald Ramp, A1GS, A2GS, Violet Baldy, MSPIKE, SPEI   Chemistry      Component Value Date/Time   NA 139 04/16/2020 1404   K 4.3 04/16/2020 1404   CL 106 04/16/2020 1404   CO2 26 04/16/2020 1404   BUN 17 04/16/2020 1404   CREATININE 1.17 04/16/2020 1404      Component Value Date/Time   CALCIUM 9.1 04/16/2020 1404   ALKPHOS 81 04/16/2020 1404   AST 19 04/16/2020 1404   ALT 15 04/16/2020 1404   BILITOT 0.4 04/16/2020 1404  Impression and Plan: Alejandro Ward is a very pleasant 69 yo caucasian gentleman with polycythemia vera, JAK 2 positive.  He continues to do well and has no complaints.  We will proceed with phlebotomy for Hct 45.2%.  He will stay on the daily baby aspirin.  He will let us know when and if he decides to proceed with Hydrea. He will have labs and phlebotomy with [facility] in 3 weeks and follow-up in 6 weeks with [place].  They will contact our office with any questions or concerns.   Laverna Peace, NP 10/5/20211:31 PM

## 2020-05-07 NOTE — Progress Notes (Signed)
Alejandro Ward presents today for phlebotomy per MD orders. Phlebotomy procedure started by Randolm Idol RN at 1435 via 16 gauge catheter and ended at 1455.  514 grams removed. Patient observed for 30 minutes after procedure without any incident. Patient tolerated procedure well. IV needle removed intact.

## 2020-05-07 NOTE — Patient Instructions (Signed)
Therapeutic Phlebotomy Therapeutic phlebotomy is the planned removal of blood from a person's body for the purpose of treating a medical condition. The procedure is similar to donating blood. Usually, about a pint (470 mL, or 0.47 L) of blood is removed. The average adult has 9-12 pints (4.3-5.7 L) of blood in the body. Therapeutic phlebotomy may be used to treat the following medical conditions:  Hemochromatosis. This is a condition in which the blood contains too much iron.  Polycythemia vera. This is a condition in which the blood contains too many red blood cells.  Porphyria cutanea tarda. This is a disease in which an important part of hemoglobin is not made properly. It results in the buildup of abnormal amounts of porphyrins in the body.  Sickle cell disease. This is a condition in which the red blood cells form an abnormal crescent shape rather than a round shape. Tell a health care provider about:  Any allergies you have.  All medicines you are taking, including vitamins, herbs, eye drops, creams, and over-the-counter medicines.  Any problems you or family members have had with anesthetic medicines.  Any blood disorders you have.  Any surgeries you have had.  Any medical conditions you have.  Whether you are pregnant or may be pregnant. What are the risks? Generally, this is a safe procedure. However, problems may occur, including:  Nausea or light-headedness.  Low blood pressure (hypotension).  Soreness, bleeding, swelling, or bruising at the needle insertion site.  Infection. What happens before the procedure?  Follow instructions from your health care provider about eating or drinking restrictions.  Ask your health care provider about: ? Changing or stopping your regular medicines. This is especially important if you are taking diabetes medicines or blood thinners (anticoagulants). ? Taking medicines such as aspirin and ibuprofen. These medicines can thin your  blood. Do not take these medicines unless your health care provider tells you to take them. ? Taking over-the-counter medicines, vitamins, herbs, and supplements.  Wear clothing with sleeves that can be raised above the elbow.  Plan to have someone take you home from the hospital or clinic.  You may have a blood sample taken.  Your blood pressure, pulse rate, and breathing rate will be measured. What happens during the procedure?   To lower your risk of infection: ? Your health care team will wash or sanitize their hands. ? Your skin will be cleaned with an antiseptic.  You may be given a medicine to numb the area (local anesthetic).  A tourniquet will be placed on your arm.  A needle will be inserted into one of your veins.  Tubing and a collection bag will be attached to that needle.  Blood will flow through the needle and tubing into the collection bag.  The collection bag will be placed lower than your arm to allow gravity to help the flow of blood into the bag.  You may be asked to open and close your hand slowly and continually during the entire collection.  After the specified amount of blood has been removed from your body, the collection bag and tubing will be clamped.  The needle will be removed from your vein.  Pressure will be held on the site of the needle insertion to stop the bleeding.  A bandage (dressing) will be placed over the needle insertion site. The procedure may vary among health care providers and hospitals. What happens after the procedure?  Your blood pressure, pulse rate, and breathing rate will be   measured after the procedure.  You will be encouraged to drink fluids.  Your recovery will be assessed and monitored.  You can return to your normal activities as told by your health care provider. Summary  Therapeutic phlebotomy is the planned removal of blood from a person's body for the purpose of treating a medical condition.  Therapeutic  phlebotomy may be used to treat hemochromatosis, polycythemia vera, porphyria cutanea tarda, or sickle cell disease.  In the procedure, a needle is inserted and about a pint (470 mL, or 0.47 L) of blood is removed. The average adult has 9-12 pints (4.3-5.7 L) of blood in the body.  This is generally a safe procedure, but it can sometimes cause problems such as nausea, light-headedness, or low blood pressure (hypotension). This information is not intended to replace advice given to you by your health care provider. Make sure you discuss any questions you have with your health care provider. Document Revised: 08/05/2017 Document Reviewed: 08/05/2017 Elsevier Patient Education  2020 Elsevier Inc.  

## 2020-05-08 ENCOUNTER — Telehealth: Payer: Self-pay | Admitting: Family

## 2020-05-08 NOTE — Telephone Encounter (Signed)
Scheduled appt per staff message from [place] scheduler Tonya - unable to reach pt . Left message for pt with appt date and time . Lab and phlebotomy in 3 wks at [facility] long location.

## 2020-05-28 ENCOUNTER — Inpatient Hospital Stay: Payer: Medicare Other

## 2020-05-28 ENCOUNTER — Other Ambulatory Visit: Payer: Self-pay

## 2020-05-28 DIAGNOSIS — D45 Polycythemia vera: Secondary | ICD-10-CM

## 2020-05-28 LAB — CBC WITH DIFFERENTIAL (CANCER CENTER ONLY)
Abs Immature Granulocytes: 0.03 10*3/uL (ref 0.00–0.07)
Basophils Absolute: 0.3 10*3/uL — ABNORMAL HIGH (ref 0.0–0.1)
Basophils Relative: 3 %
Eosinophils Absolute: 0.4 10*3/uL (ref 0.0–0.5)
Eosinophils Relative: 4 %
HCT: 42.8 % (ref 39.0–52.0)
Hemoglobin: 12.2 g/dL — ABNORMAL LOW (ref 13.0–17.0)
Immature Granulocytes: 0 %
Lymphocytes Relative: 13 %
Lymphs Abs: 1.3 10*3/uL (ref 0.7–4.0)
MCH: 19 pg — ABNORMAL LOW (ref 26.0–34.0)
MCHC: 28.5 g/dL — ABNORMAL LOW (ref 30.0–36.0)
MCV: 66.8 fL — ABNORMAL LOW (ref 80.0–100.0)
Monocytes Absolute: 0.5 10*3/uL (ref 0.1–1.0)
Monocytes Relative: 5 %
Neutro Abs: 7.6 10*3/uL (ref 1.7–7.7)
Neutrophils Relative %: 75 %
Platelet Count: 618 10*3/uL — ABNORMAL HIGH (ref 150–400)
RBC: 6.41 MIL/uL — ABNORMAL HIGH (ref 4.22–5.81)
RDW: 19.9 % — ABNORMAL HIGH (ref 11.5–15.5)
WBC Count: 10.2 10*3/uL (ref 4.0–10.5)
nRBC: 0 % (ref 0.0–0.2)

## 2020-05-28 LAB — CMP (CANCER CENTER ONLY)
ALT: 19 U/L (ref 0–44)
AST: 23 U/L (ref 15–41)
Albumin: 4.1 g/dL (ref 3.5–5.0)
Alkaline Phosphatase: 79 U/L (ref 38–126)
Anion gap: 6 (ref 5–15)
BUN: 16 mg/dL (ref 8–23)
CO2: 25 mmol/L (ref 22–32)
Calcium: 9 mg/dL (ref 8.9–10.3)
Chloride: 107 mmol/L (ref 98–111)
Creatinine: 1.13 mg/dL (ref 0.61–1.24)
GFR, Estimated: >60 mL/min (ref >60)
Glucose, Bld: 98 mg/dL (ref 70–99)
Potassium: 4.4 mmol/L (ref 3.5–5.1)
Sodium: 138 mmol/L (ref 135–145)
Total Bilirubin: 0.4 mg/dL (ref 0.3–1.2)
Total Protein: 6.4 g/dL — ABNORMAL LOW (ref 6.5–8.1)

## 2020-05-28 NOTE — Progress Notes (Unsigned)
Laurel NP, per her instruction the patient does not need a phlebotomy today.  Patients informed.

## 2020-05-29 ENCOUNTER — Other Ambulatory Visit: Payer: Medicare Other

## 2020-06-18 ENCOUNTER — Other Ambulatory Visit: Payer: Medicare Other

## 2020-06-18 ENCOUNTER — Inpatient Hospital Stay: Payer: Medicare Other | Attending: Family

## 2020-06-18 ENCOUNTER — Inpatient Hospital Stay: Payer: Medicare Other | Admitting: Family

## 2020-06-18 ENCOUNTER — Encounter: Payer: Self-pay | Admitting: Family

## 2020-06-18 ENCOUNTER — Other Ambulatory Visit: Payer: Self-pay

## 2020-06-18 ENCOUNTER — Inpatient Hospital Stay: Payer: Medicare Other

## 2020-06-18 ENCOUNTER — Inpatient Hospital Stay (HOSPITAL_BASED_OUTPATIENT_CLINIC_OR_DEPARTMENT_OTHER): Payer: Medicare Other | Admitting: Family

## 2020-06-18 VITALS — BP 132/72 | HR 63 | Temp 98.2°F | Resp 17 | Ht 71.0 in | Wt 190.1 lb

## 2020-06-18 VITALS — BP 126/60 | HR 55 | Temp 97.6°F | Resp 18

## 2020-06-18 DIAGNOSIS — D45 Polycythemia vera: Secondary | ICD-10-CM

## 2020-06-18 DIAGNOSIS — Z1589 Genetic susceptibility to other disease: Secondary | ICD-10-CM

## 2020-06-18 DIAGNOSIS — D500 Iron deficiency anemia secondary to blood loss (chronic): Secondary | ICD-10-CM

## 2020-06-18 LAB — CMP (CANCER CENTER ONLY)
ALT: 14 U/L (ref 0–44)
AST: 19 U/L (ref 15–41)
Albumin: 4.3 g/dL (ref 3.5–5.0)
Alkaline Phosphatase: 68 U/L (ref 38–126)
Anion gap: 4 — ABNORMAL LOW (ref 5–15)
BUN: 17 mg/dL (ref 8–23)
CO2: 29 mmol/L (ref 22–32)
Calcium: 9.7 mg/dL (ref 8.9–10.3)
Chloride: 105 mmol/L (ref 98–111)
Creatinine: 1.18 mg/dL (ref 0.61–1.24)
GFR, Estimated: >60 mL/min (ref >60)
Glucose, Bld: 83 mg/dL (ref 70–99)
Potassium: 4.6 mmol/L (ref 3.5–5.1)
Sodium: 138 mmol/L (ref 135–145)
Total Bilirubin: 0.5 mg/dL (ref 0.3–1.2)
Total Protein: 6.2 g/dL — ABNORMAL LOW (ref 6.5–8.1)

## 2020-06-18 LAB — CBC WITH DIFFERENTIAL (CANCER CENTER ONLY)
Abs Immature Granulocytes: 0.05 10*3/uL (ref 0.00–0.07)
Basophils Absolute: 0.3 10*3/uL — ABNORMAL HIGH (ref 0.0–0.1)
Basophils Relative: 3 %
Eosinophils Absolute: 0.3 10*3/uL (ref 0.0–0.5)
Eosinophils Relative: 4 %
HCT: 45.8 % (ref 39.0–52.0)
Hemoglobin: 12.9 g/dL — ABNORMAL LOW (ref 13.0–17.0)
Immature Granulocytes: 1 %
Lymphocytes Relative: 13 %
Lymphs Abs: 1.3 10*3/uL (ref 0.7–4.0)
MCH: 19.1 pg — ABNORMAL LOW (ref 26.0–34.0)
MCHC: 28.2 g/dL — ABNORMAL LOW (ref 30.0–36.0)
MCV: 67.7 fL — ABNORMAL LOW (ref 80.0–100.0)
Monocytes Absolute: 0.6 10*3/uL (ref 0.1–1.0)
Monocytes Relative: 6 %
Neutro Abs: 7.2 10*3/uL (ref 1.7–7.7)
Neutrophils Relative %: 73 %
Platelet Count: 661 10*3/uL — ABNORMAL HIGH (ref 150–400)
RBC: 6.77 MIL/uL — ABNORMAL HIGH (ref 4.22–5.81)
RDW: 20 % — ABNORMAL HIGH (ref 11.5–15.5)
WBC Count: 9.8 10*3/uL (ref 4.0–10.5)
nRBC: 0 % (ref 0.0–0.2)

## 2020-06-18 NOTE — Progress Notes (Signed)
Alejandro Ward presents today for phlebotomy per MD orders. Phlebotomy procedure started at 1008 and ended at 1016. 520 cc removed via 16 G needle at R antecubital site. Patient tolerated procedure well. Pt. Refused to wait 30 min post phlebotomy. Released stable and asymptomatic.

## 2020-06-18 NOTE — Progress Notes (Signed)
Hematology and Oncology Follow Up Visit  Alejandro Ward [id number] 1950-10-13 69 y.o. 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 He is doing well and has no complaints at this time.  Hct is 45.8, MCV 67, Platelets 661 and WBC count 9.8.  No episodes of blood loss noted. No abnormal brusing, no petechiae.  No fever, chills, n/v, cough, rash, dizziness, SOB, chest pain, palpitations, abdominal pain or changes in bowel or bladder habits.  No swelling, tenderness, numbness or tingling in his extremities.  He continues to have a good appetite and is doing his best to stay well hydrated throughout the day.  His weight is stable at 190 lbs.   ECOG Performance Status: 1 - Symptomatic but completely ambulatory  Medications:  Allergies as of 06/18/2020   No Known Allergies     Medication List       Accurate as of June 18, 2020  9:05 AM. If you have any questions, ask your nurse or doctor.        aspirin 81 MG EC tablet Take by mouth.       Allergies: No Known Allergies  Past Medical History, Surgical history, Social history, and Family History were reviewed and updated.  Review of Systems: All other 10 point review of systems is negative.   Physical Exam:  height is 5\' 11"  (1.803 m) and weight is 190 lb 1.9 oz (86.2 kg). His oral temperature is 98.2 F (36.8 C). His blood pressure is 132/72 and his pulse is 63. His respiration is 17 and oxygen saturation is 99%.   Wt Readings from Last 3 Encounters:  06/18/20 190 lb 1.9 oz (86.2 kg)  05/07/20 188 lb (85.3 kg)  04/19/20 187 lb 3.2 oz (84.9 kg)    Ocular: Sclerae unicteric, pupils equal, round and reactive to light Ear-nose-throat: Oropharynx clear, dentition fair Lymphatic: No cervical or  supraclavicular adenopathy Lungs no rales or rhonchi, good excursion bilaterally Heart regular rate and rhythm, no murmur appreciated Abd soft, nontender, positive bowel sounds, no liver or spleen tip palpated on exam, no fluid wave  MSK no focal spinal tenderness, no joint edema Neuro: non-focal, well-oriented, appropriate affect Breasts: Deferred   Lab Results  Component Value Date   WBC 9.8 06/18/2020   HGB 12.9 (L) 06/18/2020   HCT 45.8 06/18/2020   MCV 67.7 (L) 06/18/2020   PLT 661 (H) 06/18/2020   Lab Results  Component Value Date   FERRITIN 6 (L) 03/26/2020   IRON 19 (L) 03/26/2020   TIBC 405 03/26/2020   UIBC 386 (H) 03/26/2020   IRONPCTSAT 5 (L) 03/26/2020   Lab Results  Component Value Date   RETICCTPCT 1.2 03/26/2020   RBC 6.77 (H) 06/18/2020   No results found for: KPAFRELGTCHN, LAMBDASER, KAPLAMBRATIO No results found for: IGGSERUM, IGA, IGMSERUM No results found for: Ronnald Ramp, A1GS, A2GS, Violet Baldy, MSPIKE, SPEI   Chemistry      Component Value Date/Time   NA 138 06/18/2020 0832   K 4.6 06/18/2020 0832   CL 105 06/18/2020 0832   CO2 29 06/18/2020 0832   BUN 17 06/18/2020 0832   CREATININE 1.18 06/18/2020 0832      Component Value Date/Time   CALCIUM 9.7 06/18/2020 0832  ALKPHOS 68 06/18/2020 0832   AST 19 06/18/2020 0832   ALT 14 06/18/2020 0832   BILITOT 0.5 06/18/2020 0832       Impression and Plan: Alejandro Ward is a very pleasant69 yo caucasian gentleman with polycythemia vera, JAK 2 positive.  Besremi was FDA approved earlier this week as monotherapy for PV in patients without symptomatic splenomegaly. It will take 1-2 months before we have approval with [facility] as well as a manufactured product to start this. I spoke with the patient about this and he is very understanding.  We will go ahead and get an abdominal US to assess his spleen prior to.  Once we are able to order the drug we will check with his insurance  regarding cost as well as with [employer] for financial assistance.  He will continue his daily baby aspirin.  We will do a phlebotomy today for Hct 45.8%.  He will have lab and phlebotomy with [facility] in 3 weeks and follow-up here in 6 weeks. Hopefully by then we will be able to get him on Besremi injection SQ once every 2 weeks to start.  Greater than 50% of his visit was spent counseling and coordinating care. The were encouraged to contact our office with any questions or concerns.   Laverna Peace, NP 11/16/20219:05 AM

## 2020-06-18 NOTE — Patient Instructions (Signed)

## 2020-07-09 ENCOUNTER — Encounter: Payer: Self-pay | Admitting: *Deleted

## 2020-07-09 ENCOUNTER — Inpatient Hospital Stay: Payer: Medicare Other | Attending: Hematology & Oncology

## 2020-07-09 ENCOUNTER — Inpatient Hospital Stay: Payer: Medicare Other

## 2020-07-09 ENCOUNTER — Other Ambulatory Visit: Payer: Self-pay

## 2020-07-09 DIAGNOSIS — Z7982 Long term (current) use of aspirin: Secondary | ICD-10-CM | POA: Insufficient documentation

## 2020-07-09 DIAGNOSIS — Z79899 Other long term (current) drug therapy: Secondary | ICD-10-CM | POA: Insufficient documentation

## 2020-07-09 DIAGNOSIS — K824 Cholesterolosis of gallbladder: Secondary | ICD-10-CM | POA: Diagnosis not present

## 2020-07-09 DIAGNOSIS — Z1589 Genetic susceptibility to other disease: Secondary | ICD-10-CM

## 2020-07-09 DIAGNOSIS — R161 Splenomegaly, not elsewhere classified: Secondary | ICD-10-CM | POA: Diagnosis not present

## 2020-07-09 DIAGNOSIS — D45 Polycythemia vera: Secondary | ICD-10-CM | POA: Insufficient documentation

## 2020-07-09 LAB — CBC WITH DIFFERENTIAL (CANCER CENTER ONLY)
Abs Immature Granulocytes: 0.05 10*3/uL (ref 0.00–0.07)
Basophils Absolute: 0.2 10*3/uL — ABNORMAL HIGH (ref 0.0–0.1)
Basophils Relative: 2 %
Eosinophils Absolute: 0.3 10*3/uL (ref 0.0–0.5)
Eosinophils Relative: 4 %
HCT: 43 % (ref 39.0–52.0)
Hemoglobin: 12.4 g/dL — ABNORMAL LOW (ref 13.0–17.0)
Immature Granulocytes: 1 %
Lymphocytes Relative: 12 %
Lymphs Abs: 1.1 10*3/uL (ref 0.7–4.0)
MCH: 19 pg — ABNORMAL LOW (ref 26.0–34.0)
MCHC: 28.8 g/dL — ABNORMAL LOW (ref 30.0–36.0)
MCV: 65.8 fL — ABNORMAL LOW (ref 80.0–100.0)
Monocytes Absolute: 0.6 10*3/uL (ref 0.1–1.0)
Monocytes Relative: 6 %
Neutro Abs: 6.9 10*3/uL (ref 1.7–7.7)
Neutrophils Relative %: 75 %
Platelet Count: 624 10*3/uL — ABNORMAL HIGH (ref 150–400)
RBC: 6.53 MIL/uL — ABNORMAL HIGH (ref 4.22–5.81)
RDW: 19.5 % — ABNORMAL HIGH (ref 11.5–15.5)
WBC Count: 9.1 10*3/uL (ref 4.0–10.5)
nRBC: 0 % (ref 0.0–0.2)

## 2020-07-09 LAB — CMP (CANCER CENTER ONLY)
ALT: 20 U/L (ref 0–44)
AST: 25 U/L (ref 15–41)
Albumin: 3.9 g/dL (ref 3.5–5.0)
Alkaline Phosphatase: 72 U/L (ref 38–126)
Anion gap: 7 (ref 5–15)
BUN: 15 mg/dL (ref 8–23)
CO2: 26 mmol/L (ref 22–32)
Calcium: 9.5 mg/dL (ref 8.9–10.3)
Chloride: 108 mmol/L (ref 98–111)
Creatinine: 1.26 mg/dL — ABNORMAL HIGH (ref 0.61–1.24)
GFR, Estimated: >60 mL/min (ref >60)
Glucose, Bld: 95 mg/dL (ref 70–99)
Potassium: 4.3 mmol/L (ref 3.5–5.1)
Sodium: 141 mmol/L (ref 135–145)
Total Bilirubin: 0.6 mg/dL (ref 0.3–1.2)
Total Protein: 6.5 g/dL (ref 6.5–8.1)

## 2020-07-09 NOTE — Progress Notes (Signed)
Per office note, phlebotomy to be preformed for hct greater than 45%.  Hct 43 today.  SW pt in lobby.  No complaints.  Copy of labs and schedule given.  Pt ambulatory and well upon discharge.

## 2020-07-19 ENCOUNTER — Other Ambulatory Visit: Payer: Self-pay

## 2020-07-19 ENCOUNTER — Encounter: Payer: Self-pay | Admitting: Internal Medicine

## 2020-07-19 ENCOUNTER — Ambulatory Visit (INDEPENDENT_AMBULATORY_CARE_PROVIDER_SITE_OTHER): Payer: Medicare Other | Admitting: Internal Medicine

## 2020-07-19 VITALS — BP 110/70 | HR 64 | Temp 98.2°F | Wt 190.1 lb

## 2020-07-19 DIAGNOSIS — D45 Polycythemia vera: Secondary | ICD-10-CM

## 2020-07-19 DIAGNOSIS — R030 Elevated blood-pressure reading, without diagnosis of hypertension: Secondary | ICD-10-CM

## 2020-07-19 NOTE — Progress Notes (Signed)
Established Patient Office Visit     This visit occurred during the SARS-CoV-2 public health emergency.  Safety protocols were in place, including screening questions prior to the visit, additional usage of staff PPE, and extensive cleaning of exam room while observing appropriate contact time as indicated for disinfecting solutions.    CC/Reason for Visit: Follow-up blood pressure  HPI: Alejandro Ward is a 69 y.o. male who is coming in today for the above mentioned reasons. Past Medical History is significant for: Polycythemia vera followed by Dr. Marin Olp who is treated with recurrent phlebotomy.  At our initial visit back in September he was noted to have an elevated blood pressure.  He was asked to do ambulatory monitoring and return today for follow-up.  He has only checked his blood pressure a few times since then, his measurements have been 134/63 and 136/84.  His blood pressure in office today is 110/70 and his blood pressure in November when he went to see his hematologist was 126/60.  He has no acute complaints today.   Past Medical/Surgical History: No past medical history on file.  Past Surgical History:  Procedure Laterality Date  . NOSE SURGERY     broke as a child in football    Social History:  reports that he has never smoked. He has never used smokeless tobacco. He reports current alcohol use. No history on file for drug use.  Allergies: No Known Allergies  Family History:  Family History  Problem Relation Age of Onset  . Diabetes Mother   . Hypertension Mother   . Heart attack Father   . Hyperlipidemia Neg Hx   . Sudden death Neg Hx      Current Outpatient Medications:  .  aspirin 81 MG EC tablet, Take by mouth., Disp: , Rfl:   Review of Systems:  Constitutional: Denies fever, chills, diaphoresis, appetite change and fatigue.  HEENT: Denies photophobia, eye pain, redness, hearing loss, ear pain, congestion, sore throat, rhinorrhea,  sneezing, mouth sores, trouble swallowing, neck pain, neck stiffness and tinnitus.   Respiratory: Denies SOB, DOE, cough, chest tightness,  and wheezing.   Cardiovascular: Denies chest pain, palpitations and leg swelling.  Gastrointestinal: Denies nausea, vomiting, abdominal pain, diarrhea, constipation, blood in stool and abdominal distention.  Genitourinary: Denies dysuria, urgency, frequency, hematuria, flank pain and difficulty urinating.  Endocrine: Denies: hot or cold intolerance, sweats, changes in hair or nails, polyuria, polydipsia. Musculoskeletal: Denies myalgias, back pain, joint swelling, arthralgias and gait problem.  Skin: Denies pallor, rash and wound.  Neurological: Denies dizziness, seizures, syncope, weakness, light-headedness, numbness and headaches.  Hematological: Denies adenopathy. Easy bruising, personal or family bleeding history  Psychiatric/Behavioral: Denies suicidal ideation, mood changes, confusion, nervousness, sleep disturbance and agitation    Physical Exam: Vitals:   07/19/20 0758  BP: 110/70  Pulse: 64  Temp: 98.2 F (36.8 C)  TempSrc: Oral  SpO2: 98%  Weight: 190 lb 1.6 oz (86.2 kg)    Body mass index is 26.51 kg/m.   Constitutional: NAD, calm, comfortable Eyes: PERRL, lids and conjunctivae normal, wears corrective lenses ENMT: Mucous membranes are moist.  Respiratory: clear to auscultation bilaterally, no wheezing, no crackles. Normal respiratory effort. No accessory muscle use.  Cardiovascular: Regular rate and rhythm, no murmurs / rubs / gallops. No extremity edema. Neurologic: Grossly intact and nonfocal Psychiatric: Normal judgment and insight. Alert and oriented x 3. Normal mood.    Impression and Plan:  Elevated BP without diagnosis of hypertension -Blood pressure  is well within range of normal today. -Continue to monitor, not on medications.  Polycythemia vera ([place]) -Continue follow-up with hematology and phlebotomy as  scheduled.    Lelon Frohlich, MD [facility]

## 2020-07-30 ENCOUNTER — Other Ambulatory Visit: Payer: Self-pay

## 2020-07-30 ENCOUNTER — Inpatient Hospital Stay: Payer: Medicare Other

## 2020-07-30 ENCOUNTER — Telehealth: Payer: Self-pay | Admitting: Hematology & Oncology

## 2020-07-30 ENCOUNTER — Encounter: Payer: Self-pay | Admitting: Hematology & Oncology

## 2020-07-30 ENCOUNTER — Inpatient Hospital Stay (HOSPITAL_BASED_OUTPATIENT_CLINIC_OR_DEPARTMENT_OTHER): Payer: Medicare Other | Admitting: Hematology & Oncology

## 2020-07-30 ENCOUNTER — Ambulatory Visit (HOSPITAL_BASED_OUTPATIENT_CLINIC_OR_DEPARTMENT_OTHER)
Admission: RE | Admit: 2020-07-30 | Discharge: 2020-07-30 | Disposition: A | Discharge status: Home / Self Care | Payer: Medicare Other | Source: Ambulatory Visit | Attending: Family | Admitting: Family

## 2020-07-30 VITALS — BP 123/68 | HR 61 | Temp 97.9°F | Resp 18 | Wt 187.8 lb

## 2020-07-30 DIAGNOSIS — D45 Polycythemia vera: Secondary | ICD-10-CM

## 2020-07-30 DIAGNOSIS — Z1589 Genetic susceptibility to other disease: Secondary | ICD-10-CM

## 2020-07-30 DIAGNOSIS — D500 Iron deficiency anemia secondary to blood loss (chronic): Secondary | ICD-10-CM

## 2020-07-30 LAB — CBC WITH DIFFERENTIAL (CANCER CENTER ONLY)
Abs Immature Granulocytes: 0.04 10*3/uL (ref 0.00–0.07)
Basophils Absolute: 0.3 10*3/uL — ABNORMAL HIGH (ref 0.0–0.1)
Basophils Relative: 3 %
Eosinophils Absolute: 0.3 10*3/uL (ref 0.0–0.5)
Eosinophils Relative: 3 %
HCT: 44.6 % (ref 39.0–52.0)
Hemoglobin: 12.6 g/dL — ABNORMAL LOW (ref 13.0–17.0)
Immature Granulocytes: 0 %
Lymphocytes Relative: 12 %
Lymphs Abs: 1.2 10*3/uL (ref 0.7–4.0)
MCH: 18.9 pg — ABNORMAL LOW (ref 26.0–34.0)
MCHC: 28.3 g/dL — ABNORMAL LOW (ref 30.0–36.0)
MCV: 66.9 fL — ABNORMAL LOW (ref 80.0–100.0)
Monocytes Absolute: 0.5 10*3/uL (ref 0.1–1.0)
Monocytes Relative: 5 %
Neutro Abs: 7.1 10*3/uL (ref 1.7–7.7)
Neutrophils Relative %: 77 %
Platelet Count: 567 10*3/uL — ABNORMAL HIGH (ref 150–400)
RBC: 6.67 MIL/uL — ABNORMAL HIGH (ref 4.22–5.81)
RDW: 19.4 % — ABNORMAL HIGH (ref 11.5–15.5)
WBC Count: 9.4 10*3/uL (ref 4.0–10.5)
nRBC: 0 % (ref 0.0–0.2)

## 2020-07-30 LAB — CMP (CANCER CENTER ONLY)
ALT: 17 U/L (ref 0–44)
AST: 22 U/L (ref 15–41)
Albumin: 4.4 g/dL (ref 3.5–5.0)
Alkaline Phosphatase: 72 U/L (ref 38–126)
Anion gap: 6 (ref 5–15)
BUN: 22 mg/dL (ref 8–23)
CO2: 30 mmol/L (ref 22–32)
Calcium: 9.8 mg/dL (ref 8.9–10.3)
Chloride: 106 mmol/L (ref 98–111)
Creatinine: 1.31 mg/dL — ABNORMAL HIGH (ref 0.61–1.24)
GFR, Estimated: 59 mL/min — ABNORMAL LOW (ref >60)
Glucose, Bld: 111 mg/dL — ABNORMAL HIGH (ref 70–99)
Potassium: 4.1 mmol/L (ref 3.5–5.1)
Sodium: 142 mmol/L (ref 135–145)
Total Bilirubin: 0.7 mg/dL (ref 0.3–1.2)
Total Protein: 6.7 g/dL (ref 6.5–8.1)

## 2020-07-30 LAB — IRON AND TIBC
Iron: 16 ug/dL — ABNORMAL LOW (ref 42–163)
Saturation Ratios: 4 % — ABNORMAL LOW (ref 20–55)
TIBC: 391 ug/dL (ref 202–409)
UIBC: 375 ug/dL (ref 117–376)

## 2020-07-30 LAB — FERRITIN: Ferritin: 4 ng/mL — ABNORMAL LOW (ref 24–336)

## 2020-07-30 NOTE — Progress Notes (Signed)
Hematology and Oncology Follow Up Visit  Alejandro Ward [id number] 01/05/1951 69 y.o. 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 is here today for follow-up.  He is doing quite well.  He is enjoying the nice weather.  He is doing some hiking.  His wife is on the cell phone.  She is driving back from [facility].  She is currently in [place].  He had an ultrasound of the abdomen done this morning.  This showed mild splenomegaly with his spleen size of 787 cm.  Also on the ultrasound, there was a 6 mm gallbladder polyp that we will have to monitor.  There is no issue with respect to his kidneys or liver.  He is on aspirin.  He is doing well on the aspirin.  He has had no rashes.  He has had no change in bowel or bladder habits.  There has been no cough.  He and his wife have been vaccinated.  He has had no bleeding.  There has been no fever.  Overall, his performance status is ECOG 0.    Medications:  Allergies as of 07/30/2020   No Known Allergies     Medication List       Accurate as of July 30, 2020 10:24 AM. If you have any questions, ask your nurse or doctor.        aspirin 81 MG EC tablet Take by mouth.       Allergies: No Known Allergies  Past Medical History, Surgical history, Social history, and Family History were reviewed and updated.  Review of Systems: All other 10 point review of systems is negative.   Physical Exam:  weight is 187 lb 12 oz (85.2 kg). His oral temperature is 97.9 F (36.6 C). His blood pressure is 123/68 and his pulse is 61. His respiration is 18 and oxygen saturation is 100%.   Wt Readings from Last 3 Encounters:  07/30/20 187 lb 12 oz (85.2 kg)  07/19/20 190 lb 1.6 oz (86.2 kg)  06/18/20 190 lb 1.9 oz (86.2 kg)    Physical Exam Vitals reviewed.   HENT:     Head: Normocephalic and atraumatic.     Mouth/Throat:     Mouth: Oropharynx is clear and moist.  Eyes:     Extraocular Movements: EOM normal.     Pupils: Pupils are equal, round, and reactive to light.  Cardiovascular:     Rate and Rhythm: Normal rate and regular rhythm.     Heart sounds: Normal heart sounds.  Pulmonary:     Effort: Pulmonary effort is normal.     Breath sounds: Normal breath sounds.  Abdominal:     General: Bowel sounds are normal.     Palpations: Abdomen is soft.     Comments: Abdominal exam shows a soft abdomen.  He has good bowel sounds.  There is no guarding or rebound tenderness.  I cannot palpate his liver tip or splenic edge.  Musculoskeletal:        General: No tenderness, deformity or edema. Normal range of motion.     Cervical back: Normal range of motion.  Lymphadenopathy:     Cervical: No cervical adenopathy.  Skin:    General: Skin is warm and dry.     Findings: No erythema or  rash.  Neurological:     Mental Status: He is alert and oriented to person, place, and time.  Psychiatric:        Mood and Affect: Mood and affect normal.        Behavior: Behavior normal.        Thought Content: Thought content normal.        Judgment: Judgment normal.      Lab Results  Component Value Date   WBC 9.4 07/30/2020   HGB 12.6 (L) 07/30/2020   HCT 44.6 07/30/2020   MCV 66.9 (L) 07/30/2020   PLT 567 (H) 07/30/2020   Lab Results  Component Value Date   FERRITIN 6 (L) 03/26/2020   IRON 19 (L) 03/26/2020   TIBC 405 03/26/2020   UIBC 386 (H) 03/26/2020   IRONPCTSAT 5 (L) 03/26/2020   Lab Results  Component Value Date   RETICCTPCT 1.2 03/26/2020   RBC 6.67 (H) 07/30/2020   No results found for: KPAFRELGTCHN, LAMBDASER, KAPLAMBRATIO No results found for: IGGSERUM, IGA, IGMSERUM No results found for: Odetta Pink, SPEI   Chemistry      Component Value Date/Time   NA 142 07/30/2020  0933   K 4.1 07/30/2020 0933   CL 106 07/30/2020 0933   CO2 30 07/30/2020 0933   BUN 22 07/30/2020 0933   CREATININE 1.31 (H) 07/30/2020 0933      Component Value Date/Time   CALCIUM 9.8 07/30/2020 0933   ALKPHOS 72 07/30/2020 0933   AST 22 07/30/2020 0933   ALT 17 07/30/2020 0933   BILITOT 0.7 07/30/2020 0933       Impression and Plan: Mr. Forker is a very pleasant69 yo caucasian gentleman with polycythemia vera, JAK 2 positive.   For right now, he will stay off any type of immunosuppressive therapy.  We typically utilize Hydrea.  I think that he and his wife probably are looking more toward an interferon-based regimen.  I told him that this is clearly much more immunosuppressive than Hydrea.  He would have to be very cautious with respect to the coronavirus, even though he has been vaccinated.  I would not repeat a another ultrasound for 6 months.  He does not need to be phlebotomized today.  We will plan to get him back in another couple months.  We will get lab work on him in 1 month at [facility].     Volanda Napoleon, MD 12/28/202110:24 AM

## 2020-07-30 NOTE — Telephone Encounter (Signed)
Appointments scheduled patient has My Chart Access per 12/28 los

## 2020-08-30 ENCOUNTER — Inpatient Hospital Stay: Payer: Medicare Other | Attending: Hematology & Oncology

## 2020-08-30 ENCOUNTER — Other Ambulatory Visit: Payer: Self-pay

## 2020-08-30 DIAGNOSIS — D45 Polycythemia vera: Secondary | ICD-10-CM | POA: Diagnosis not present

## 2020-08-30 LAB — LACTATE DEHYDROGENASE: LDH: 145 U/L (ref 98–192)

## 2020-08-30 LAB — CMP (CANCER CENTER ONLY)
ALT: 17 U/L (ref 0–44)
AST: 21 U/L (ref 15–41)
Albumin: 4 g/dL (ref 3.5–5.0)
Alkaline Phosphatase: 73 U/L (ref 38–126)
Anion gap: 8 (ref 5–15)
BUN: 18 mg/dL (ref 8–23)
CO2: 23 mmol/L (ref 22–32)
Calcium: 8.8 mg/dL — ABNORMAL LOW (ref 8.9–10.3)
Chloride: 108 mmol/L (ref 98–111)
Creatinine: 1.19 mg/dL (ref 0.61–1.24)
GFR, Estimated: >60 mL/min (ref >60)
Glucose, Bld: 102 mg/dL — ABNORMAL HIGH (ref 70–99)
Potassium: 4.4 mmol/L (ref 3.5–5.1)
Sodium: 139 mmol/L (ref 135–145)
Total Bilirubin: 0.6 mg/dL (ref 0.3–1.2)
Total Protein: 6.5 g/dL (ref 6.5–8.1)

## 2020-08-30 LAB — CBC WITH DIFFERENTIAL (CANCER CENTER ONLY)
Abs Immature Granulocytes: 0.04 10*3/uL (ref 0.00–0.07)
Basophils Absolute: 0.3 10*3/uL — ABNORMAL HIGH (ref 0.0–0.1)
Basophils Relative: 3 %
Eosinophils Absolute: 0.3 10*3/uL (ref 0.0–0.5)
Eosinophils Relative: 4 %
HCT: 44.1 % (ref 39.0–52.0)
Hemoglobin: 12.7 g/dL — ABNORMAL LOW (ref 13.0–17.0)
Immature Granulocytes: 0 %
Lymphocytes Relative: 11 %
Lymphs Abs: 1.1 10*3/uL (ref 0.7–4.0)
MCH: 18.8 pg — ABNORMAL LOW (ref 26.0–34.0)
MCHC: 28.8 g/dL — ABNORMAL LOW (ref 30.0–36.0)
MCV: 65.2 fL — ABNORMAL LOW (ref 80.0–100.0)
Monocytes Absolute: 0.5 10*3/uL (ref 0.1–1.0)
Monocytes Relative: 5 %
Neutro Abs: 7.4 10*3/uL (ref 1.7–7.7)
Neutrophils Relative %: 77 %
Platelet Count: 652 10*3/uL — ABNORMAL HIGH (ref 150–400)
RBC: 6.76 MIL/uL — ABNORMAL HIGH (ref 4.22–5.81)
RDW: 19.9 % — ABNORMAL HIGH (ref 11.5–15.5)
WBC Count: 9.6 10*3/uL (ref 4.0–10.5)
nRBC: 0 % (ref 0.0–0.2)

## 2020-08-30 LAB — FERRITIN: Ferritin: 4 ng/mL — ABNORMAL LOW (ref 24–336)

## 2020-08-30 LAB — IRON AND TIBC
Iron: 19 ug/dL — ABNORMAL LOW (ref 42–163)
Saturation Ratios: 5 % — ABNORMAL LOW (ref 20–55)
TIBC: 369 ug/dL (ref 202–409)
UIBC: 350 ug/dL (ref 117–376)

## 2020-08-30 LAB — SAVE SMEAR(SSMR), FOR PROVIDER SLIDE REVIEW

## 2020-09-24 IMAGING — CT CT BIOPSY AND ASPIRATION BONE MARROW
1 of 2 series · 15 of 28 positions shown, 19 images · non-contrast
Comparison: none

CLINICAL DATA: History of polycythemia Sobeida and N7V-G gene mutation
with possible underlying myelodysplasia.

[Series 2: i-spiral 5.0 br40 · axial · 0.98mm/px · z∈[+1428,+1508]mm · 15 of 27 slices shown, 19 images]
[im 2/27  mediastinal]
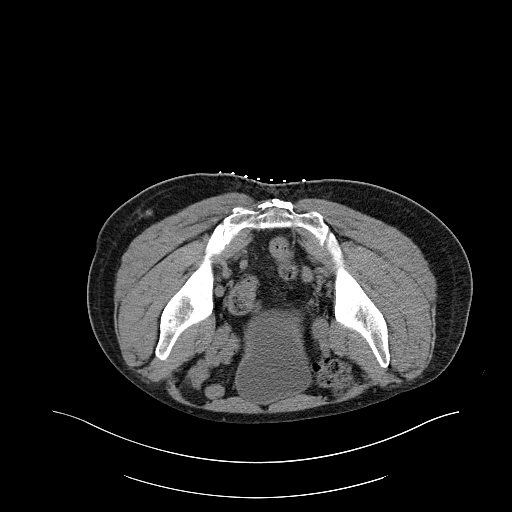
[im 2/27  lung]
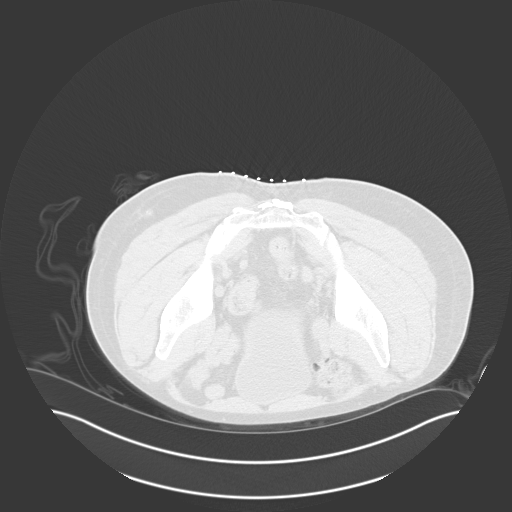
[im 4/27  lung]
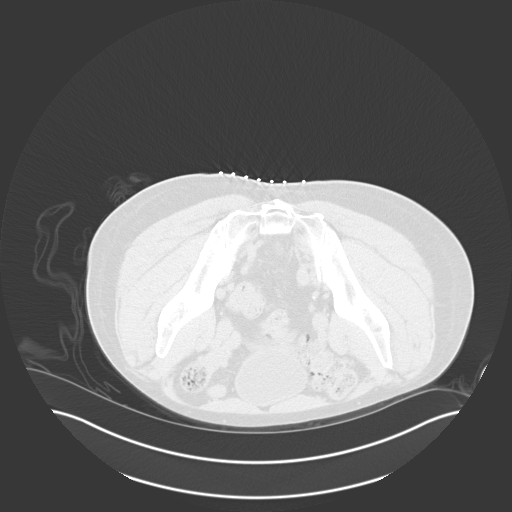
[im 5/27  lung]
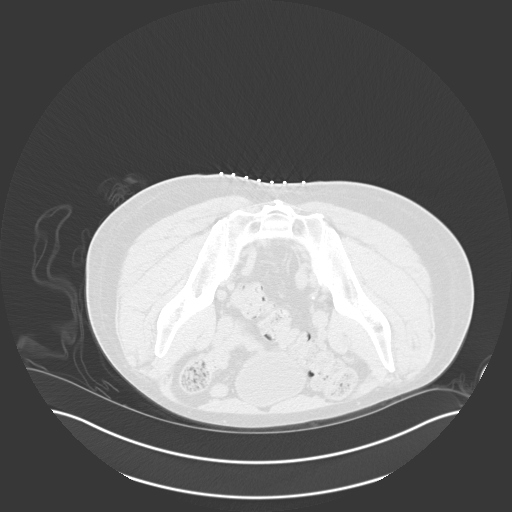
[im 7/27  lung]
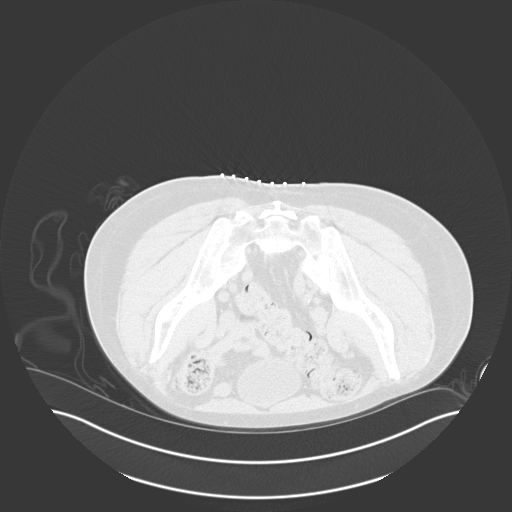
[im 8/27  mediastinal]
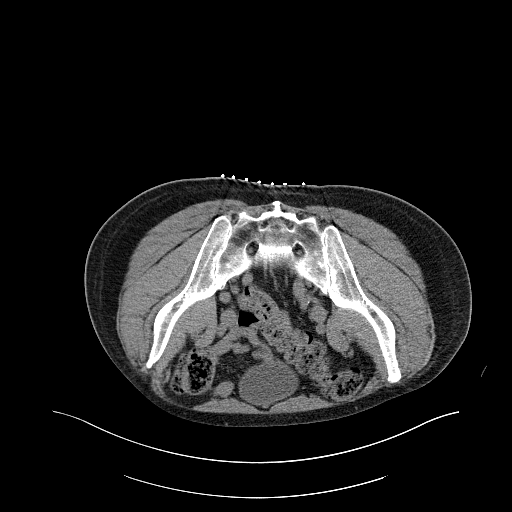
[im 8/27  lung]
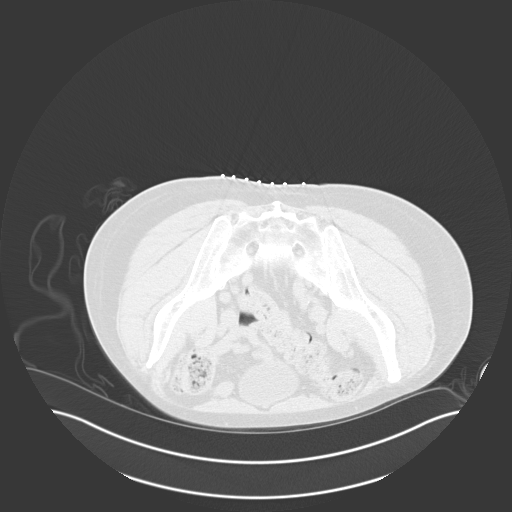
[im 10/27  lung]
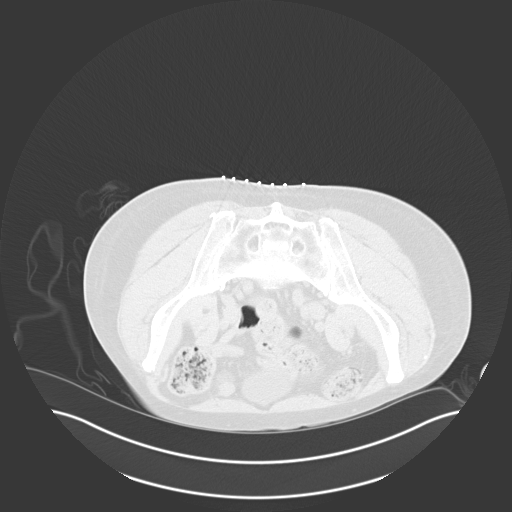
[im 11/27  lung]
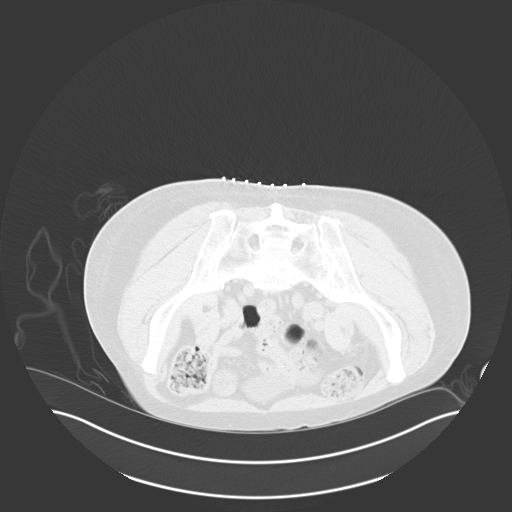
[im 14/27  lung]
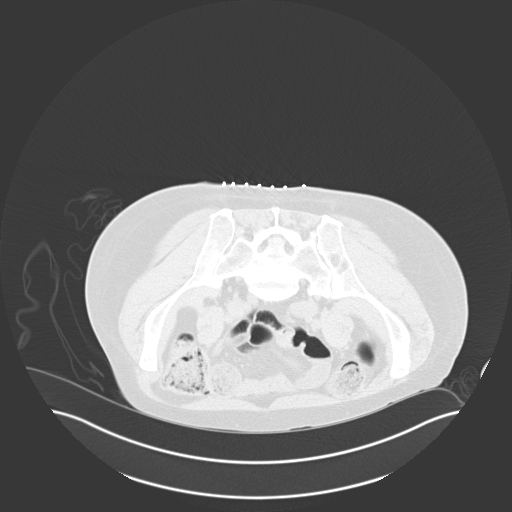
[im 16/27  mediastinal]
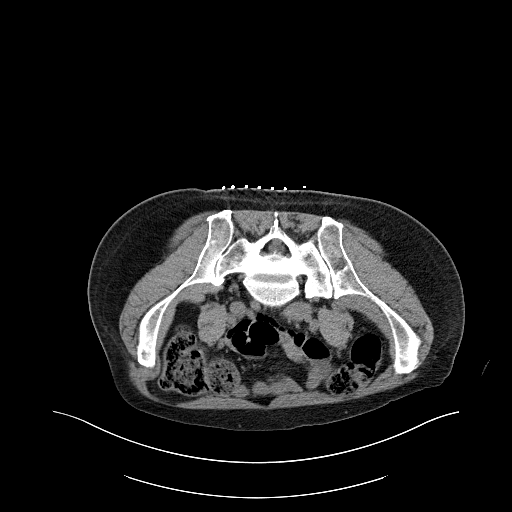
[im 16/27  lung]
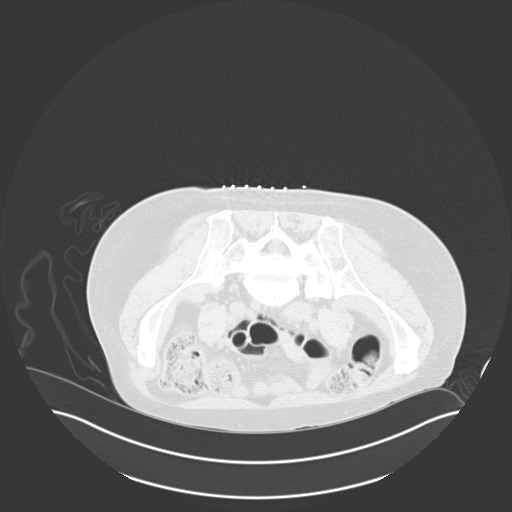
[im 17/27  lung]
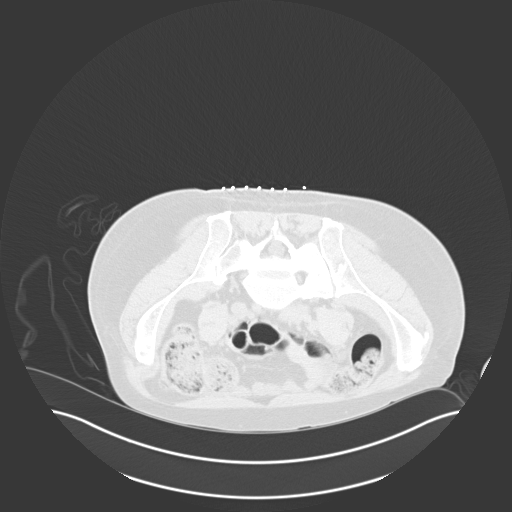
[im 19/27  lung]
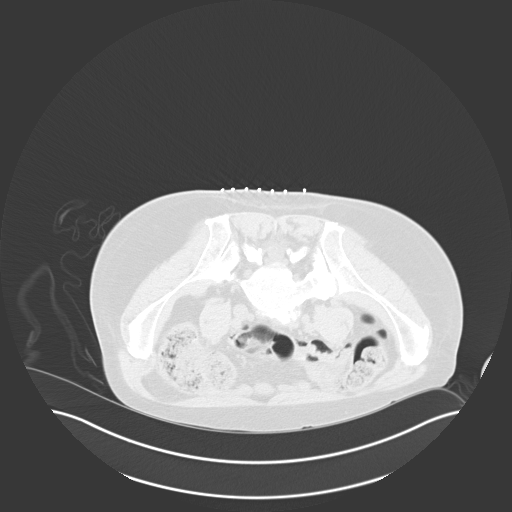
[im 20/27  lung]
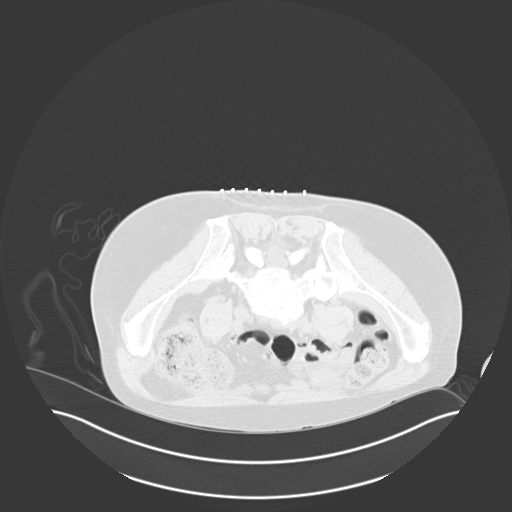
[im 22/27  mediastinal]
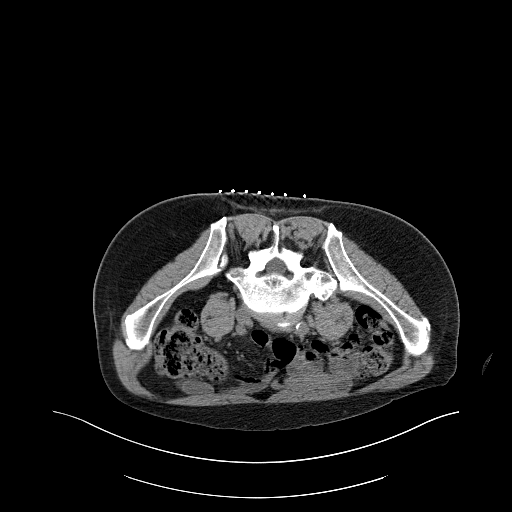
[im 22/27  lung]
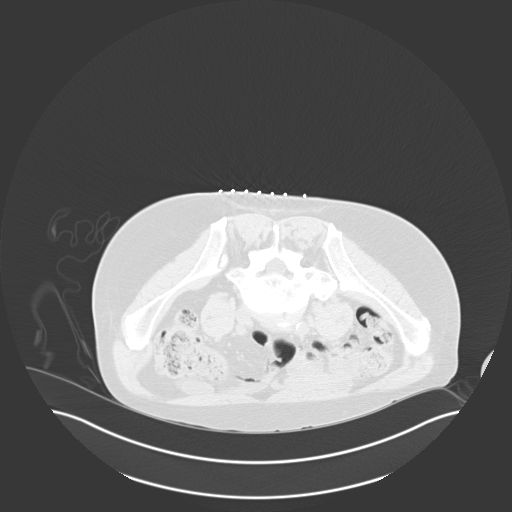
[im 23/27  lung]
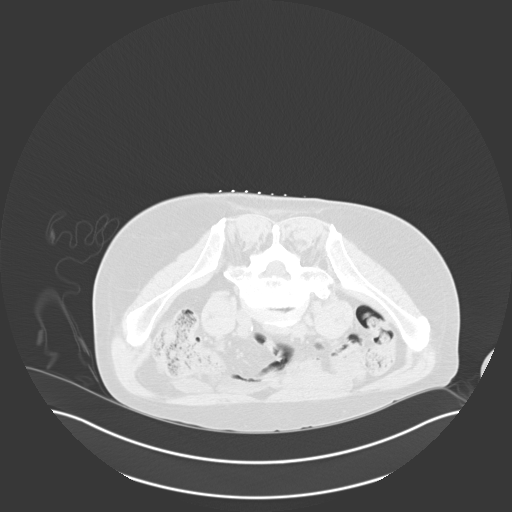
[im 25/27  lung]
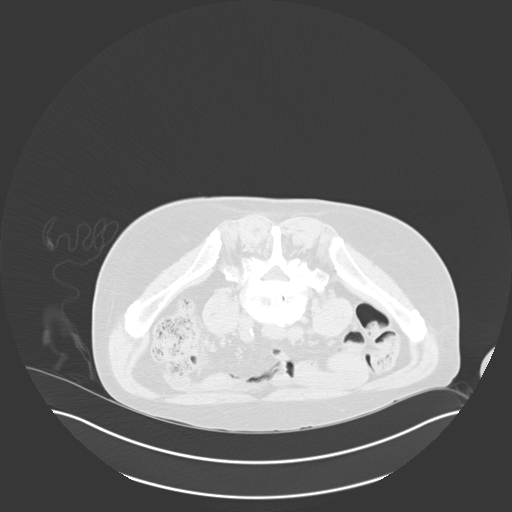

[15 of 28 positions shown; findings below may reference images not displayed]

EXAM:
CT GUIDED BONE MARROW ASPIRATION AND BIOPSY

ANESTHESIA/SEDATION:
Versed 2.0 mg IV, Fentanyl 100 mcg IV

Total Moderate Sedation Time:   13 minutes.

The patient's level of consciousness and physiologic status were
continuously monitored during the procedure by Radiology nursing.

PROCEDURE:
The procedure risks, benefits, and alternatives were explained to
the patient. Questions regarding the procedure were encouraged and
answered. The patient understands and consents to the procedure. A
time out was performed prior to initiating the procedure.

The right gluteal region was prepped with chlorhexidine. Sterile
gown and sterile gloves were used for the procedure. Local
anesthesia was provided with 1% Lidocaine.

Under CT guidance, an 11 gauge On Control bone cutting needle was
advanced from a posterior approach into the right iliac bone. Needle
positioning was confirmed with CT. Initial non heparinized and
heparinized aspirate samples were obtained of bone marrow. Core
biopsy was performed via the On Control drill needle.

COMPLICATIONS:
None
FINDINGS: Inspection of initial aspirate did reveal visible particles. Intact
core biopsy sample was obtained.
IMPRESSION: CT guided bone marrow biopsy of right posterior iliac bone with both
aspirate and core samples obtained.

## 2020-09-30 ENCOUNTER — Other Ambulatory Visit: Payer: Self-pay | Admitting: *Deleted

## 2020-09-30 DIAGNOSIS — D469 Myelodysplastic syndrome, unspecified: Secondary | ICD-10-CM

## 2020-09-30 DIAGNOSIS — D500 Iron deficiency anemia secondary to blood loss (chronic): Secondary | ICD-10-CM

## 2020-09-30 DIAGNOSIS — D45 Polycythemia vera: Secondary | ICD-10-CM

## 2020-10-01 ENCOUNTER — Encounter: Payer: Self-pay | Admitting: Hematology & Oncology

## 2020-10-01 ENCOUNTER — Inpatient Hospital Stay: Payer: Medicare Other | Attending: Hematology & Oncology

## 2020-10-01 ENCOUNTER — Inpatient Hospital Stay: Payer: Medicare Other

## 2020-10-01 ENCOUNTER — Inpatient Hospital Stay (HOSPITAL_BASED_OUTPATIENT_CLINIC_OR_DEPARTMENT_OTHER): Payer: Medicare Other | Admitting: Hematology & Oncology

## 2020-10-01 ENCOUNTER — Telehealth: Payer: Self-pay

## 2020-10-01 ENCOUNTER — Other Ambulatory Visit: Payer: Self-pay

## 2020-10-01 ENCOUNTER — Encounter: Payer: Self-pay | Admitting: Family

## 2020-10-01 VITALS — BP 129/73 | HR 57

## 2020-10-01 VITALS — BP 151/70 | HR 68 | Temp 98.2°F | Resp 18 | Wt 190.0 lb

## 2020-10-01 DIAGNOSIS — D45 Polycythemia vera: Secondary | ICD-10-CM | POA: Insufficient documentation

## 2020-10-01 DIAGNOSIS — Z7982 Long term (current) use of aspirin: Secondary | ICD-10-CM | POA: Diagnosis not present

## 2020-10-01 DIAGNOSIS — D469 Myelodysplastic syndrome, unspecified: Secondary | ICD-10-CM

## 2020-10-01 DIAGNOSIS — D500 Iron deficiency anemia secondary to blood loss (chronic): Secondary | ICD-10-CM

## 2020-10-01 LAB — IRON AND TIBC
Iron: 22 ug/dL — ABNORMAL LOW (ref 42–163)
Saturation Ratios: 6 % — ABNORMAL LOW (ref 20–55)
TIBC: 391 ug/dL (ref 202–409)
UIBC: 369 ug/dL (ref 117–376)

## 2020-10-01 LAB — CBC WITH DIFFERENTIAL (CANCER CENTER ONLY)
Abs Immature Granulocytes: 0.03 10*3/uL (ref 0.00–0.07)
Basophils Absolute: 0.2 10*3/uL — ABNORMAL HIGH (ref 0.0–0.1)
Basophils Relative: 2 %
Eosinophils Absolute: 0.4 10*3/uL (ref 0.0–0.5)
Eosinophils Relative: 4 %
HCT: 46.2 % (ref 39.0–52.0)
Hemoglobin: 13.1 g/dL (ref 13.0–17.0)
Immature Granulocytes: 0 %
Lymphocytes Relative: 11 %
Lymphs Abs: 1.1 10*3/uL (ref 0.7–4.0)
MCH: 18.7 pg — ABNORMAL LOW (ref 26.0–34.0)
MCHC: 28.4 g/dL — ABNORMAL LOW (ref 30.0–36.0)
MCV: 66.1 fL — ABNORMAL LOW (ref 80.0–100.0)
Monocytes Absolute: 0.6 10*3/uL (ref 0.1–1.0)
Monocytes Relative: 6 %
Neutro Abs: 7.2 10*3/uL (ref 1.7–7.7)
Neutrophils Relative %: 77 %
Platelet Count: 594 10*3/uL — ABNORMAL HIGH (ref 150–400)
RBC: 6.99 MIL/uL — ABNORMAL HIGH (ref 4.22–5.81)
RDW: 20.3 % — ABNORMAL HIGH (ref 11.5–15.5)
WBC Count: 9.4 10*3/uL (ref 4.0–10.5)
nRBC: 0 % (ref 0.0–0.2)

## 2020-10-01 LAB — CMP (CANCER CENTER ONLY)
ALT: 19 U/L (ref 0–44)
AST: 22 U/L (ref 15–41)
Albumin: 4.3 g/dL (ref 3.5–5.0)
Alkaline Phosphatase: 67 U/L (ref 38–126)
Anion gap: 6 (ref 5–15)
BUN: 18 mg/dL (ref 8–23)
CO2: 29 mmol/L (ref 22–32)
Calcium: 9.4 mg/dL (ref 8.9–10.3)
Chloride: 104 mmol/L (ref 98–111)
Creatinine: 1.17 mg/dL (ref 0.61–1.24)
GFR, Estimated: >60 mL/min (ref >60)
Glucose, Bld: 86 mg/dL (ref 70–99)
Potassium: 4.4 mmol/L (ref 3.5–5.1)
Sodium: 139 mmol/L (ref 135–145)
Total Bilirubin: 0.6 mg/dL (ref 0.3–1.2)
Total Protein: 6.5 g/dL (ref 6.5–8.1)

## 2020-10-01 LAB — FERRITIN: Ferritin: 5 ng/mL — ABNORMAL LOW (ref 24–336)

## 2020-10-01 LAB — LACTATE DEHYDROGENASE: LDH: 139 U/L (ref 98–192)

## 2020-10-01 NOTE — Patient Instructions (Signed)

## 2020-10-01 NOTE — Progress Notes (Signed)
Hematology and Oncology Follow Up Visit  Alejandro Ward [id number] [birth year] 70 y.o. 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 is here today for follow-up.  Overall, he has been doing pretty well.  He is certainly going to enjoy the warm weather that we have this week.  He has had no problems with headache.  There has been no rashes.  He is does state a little bit of itching on the lower part of the left leg when he takes a shower.  He has had no fever.  There has been no cough or shortness of breath.  He has had no nausea or vomiting.  There has been no change in bowel or bladder habits.  Overall, his performance status is ECOG 1.    Medications:  Allergies as of 10/01/2020   No Known Allergies     Medication List       Accurate as of October 01, 2020  8:04 AM. If you have any questions, ask your nurse or doctor.        aspirin 81 MG EC tablet Take by mouth.       Allergies: No Known Allergies  Past Medical History, Surgical history, Social history, and Family History were reviewed and updated.  Review of Systems: Review of Systems  Constitutional: Negative.   HENT: Negative.   Eyes: Negative.   Respiratory: Negative.   Cardiovascular: Negative.   Gastrointestinal: Negative.   Genitourinary: Negative.   Musculoskeletal: Negative.   Skin: Negative.   Neurological: Negative.   Endo/Heme/Allergies: Negative.   Psychiatric/Behavioral: Negative.     Physical Exam:  weight is 190 lb (86.2 kg). His oral temperature is 98.2 F (36.8 C). His blood pressure is 151/70 (abnormal) and his pulse is 68. His respiration is 18 and oxygen saturation is 99%.   Wt Readings from Last 3 Encounters:  10/01/20 190 lb (86.2 kg)  07/30/20 187 lb 12 oz (85.2 kg)  07/19/20 190 lb 1.6 oz (86.2 kg)    Physical  Exam Vitals reviewed.  HENT:     Head: Normocephalic and atraumatic.  Eyes:     Pupils: Pupils are equal, round, and reactive to light.  Cardiovascular:     Rate and Rhythm: Normal rate and regular rhythm.     Heart sounds: Normal heart sounds.  Pulmonary:     Effort: Pulmonary effort is normal.     Breath sounds: Normal breath sounds.  Abdominal:     General: Bowel sounds are normal.     Palpations: Abdomen is soft.     Comments: Abdominal exam shows a soft abdomen.  He has good bowel sounds.  There is no guarding or rebound tenderness.  I cannot palpate his liver tip or splenic edge.  Musculoskeletal:        General: No tenderness or deformity. Normal range of motion.     Cervical back: Normal range of motion.  Lymphadenopathy:     Cervical: No cervical adenopathy.  Skin:    General: Skin is warm and dry.     Findings: No erythema or rash.  Neurological:     Mental Status: He is alert and oriented to person, place, and time.  Psychiatric:        Behavior: Behavior normal.  Thought Content: Thought content normal.        Judgment: Judgment normal.      Lab Results  Component Value Date   WBC 9.6 08/30/2020   HGB 12.7 (L) 08/30/2020   HCT 44.1 08/30/2020   MCV 65.2 (L) 08/30/2020   PLT 652 (H) 08/30/2020   Lab Results  Component Value Date   FERRITIN 4 (L) 08/30/2020   IRON 19 (L) 08/30/2020   TIBC 369 08/30/2020   UIBC 350 08/30/2020   IRONPCTSAT 5 (L) 08/30/2020   Lab Results  Component Value Date   RETICCTPCT 1.2 03/26/2020   RBC 6.76 (H) 08/30/2020   No results found for: KPAFRELGTCHN, LAMBDASER, KAPLAMBRATIO No results found for: IGGSERUM, IGA, IGMSERUM No results found for: Ronnald Ramp, A1GS, Nelida Meuse, SPEI   Chemistry      Component Value Date/Time   NA 139 08/30/2020 0803   K 4.4 08/30/2020 0803   CL 108 08/30/2020 0803   CO2 23 08/30/2020 0803   BUN 18 08/30/2020 0803   CREATININE 1.19 08/30/2020  0803      Component Value Date/Time   CALCIUM 8.8 (L) 08/30/2020 0803   ALKPHOS 73 08/30/2020 0803   AST 21 08/30/2020 0803   ALT 17 08/30/2020 0803   BILITOT 0.6 08/30/2020 0803       Impression and Plan: [name] is a very pleasant70 yo caucasian gentleman with polycythemia vera, JAK 2 positive.   We will go ahead and phlebotomize him today.  I want to make sure that we keep his hematocrit below 45%.  We still are holding off on any type of immunosuppressive therapy.  If we have to go on to anything, they would like to go on the once every 2-week long-acting interferon medication.  He has had no problems with the polycythemia.  Again, his blood counts are holding steady which is nice to see.  I will plan to check his labs monthly.  I told the to see him back until June.  When we see him back, we will go ahead and do a ultrasound of his abdomen.      Volanda Napoleon, MD 3/1/20228:04 AM

## 2020-10-01 NOTE — Progress Notes (Signed)
Alejandro Ward presents today for phlebotomy per MD orders. Phlebotomy procedure started at 0855 and ended at 0907. 517 cc removed via 16 G needle at [facility] site. Patient tolerated procedure well. Pt. Refused to wait 30 min post phlebotomy, released stable and ASX.

## 2020-10-17 ENCOUNTER — Ambulatory Visit (INDEPENDENT_AMBULATORY_CARE_PROVIDER_SITE_OTHER): Payer: Medicare Other | Admitting: Internal Medicine

## 2020-10-17 ENCOUNTER — Other Ambulatory Visit: Payer: Self-pay

## 2020-10-17 ENCOUNTER — Encounter: Payer: Self-pay | Admitting: Internal Medicine

## 2020-10-17 VITALS — BP 110/80 | HR 66 | Temp 97.8°F | Ht 71.0 in | Wt 189.5 lb

## 2020-10-17 DIAGNOSIS — Z0000 Encounter for general adult medical examination without abnormal findings: Secondary | ICD-10-CM | POA: Diagnosis not present

## 2020-10-17 DIAGNOSIS — Z1211 Encounter for screening for malignant neoplasm of colon: Secondary | ICD-10-CM | POA: Diagnosis not present

## 2020-10-17 DIAGNOSIS — D45 Polycythemia vera: Secondary | ICD-10-CM

## 2020-10-17 NOTE — Patient Instructions (Signed)
-Nice seeing you today!!  -Return fasting for labs.  -Remember tetanus and shingles vaccines at your pharmacy.  -GI referral for colonoscopy today.  -Schedule follow up in 6 months.   Preventive Care 3 Years and Older, Male Preventive care refers to lifestyle choices and visits with your health care provider that can promote health and wellness. This includes:  A yearly physical exam. This is also called an annual wellness visit.  Regular dental and eye exams.  Immunizations.  Screening for certain conditions.  Healthy lifestyle choices, such as: ? Eating a healthy diet. ? Getting regular exercise. ? Not using drugs or products that contain nicotine and tobacco. ? Limiting alcohol use. What can I expect for my preventive care visit? Physical exam Your health care provider will check your:  Height and weight. These may be used to calculate your BMI (body mass index). BMI is a measurement that tells if you are at a healthy weight.  Heart rate and blood pressure.  Body temperature.  Skin for abnormal spots. Counseling Your health care provider may ask you questions about your:  Past medical problems.  Family's medical history.  Alcohol, tobacco, and drug use.  Emotional well-being.  Home life and relationship well-being.  Sexual activity.  Diet, exercise, and sleep habits.  History of falls.  Memory and ability to understand (cognition).  Work and work Statistician.  Access to firearms. What immunizations do I need? Vaccines are usually given at various ages, according to a schedule. Your health care provider will recommend vaccines for you based on your age, medical history, and lifestyle or other factors, such as travel or where you work.   What tests do I need? Blood tests  Lipid and cholesterol levels. These may be checked every 5 years, or more often depending on your overall health.  Hepatitis C test.  Hepatitis B test. Screening  Lung  cancer screening. You may have this screening every year starting at age 4 if you have a 30-pack-year history of smoking and currently smoke or have quit within the past 15 years.  Colorectal cancer screening. ? All adults should have this screening starting at age 67 and continuing until age 17. ? Your health care provider may recommend screening at age 47 if you are at increased risk. ? You will have tests every 1-10 years, depending on your results and the type of screening test.  Prostate cancer screening. Recommendations will vary depending on your family history and other risks.  Genital exam to check for testicular cancer or hernias.  Diabetes screening. ? This is done by checking your blood sugar (glucose) after you have not eaten for a while (fasting). ? You may have this done every 1-3 years.  Abdominal aortic aneurysm (AAA) screening. You may need this if you are a current or former smoker.  STD (sexually transmitted disease) testing, if you are at risk. Follow these instructions at home: Eating and drinking  Eat a diet that includes fresh fruits and vegetables, whole grains, lean protein, and low-fat dairy products. Limit your intake of foods with high amounts of sugar, saturated fats, and salt.  Take vitamin and mineral supplements as recommended by your health care provider.  Do not drink alcohol if your health care provider tells you not to drink.  If you drink alcohol: ? Limit how much you have to 0-2 drinks a day. ? Be aware of how much alcohol is in your drink. In the [place] one drink equals one 12 oz  bottle of beer (355 mL), one 5 oz glass of wine (148 mL), or one 1 oz glass of hard liquor (44 mL).   Lifestyle  Take daily care of your teeth and gums. Brush your teeth every morning and night with fluoride toothpaste. Floss one time each day.  Stay active. Exercise for at least 30 minutes 5 or more days each week.  Do not use any products that contain nicotine or  tobacco, such as cigarettes, e-cigarettes, and chewing tobacco. If you need help quitting, ask your health care provider.  Do not use drugs.  If you are sexually active, practice safe sex. Use a condom or other form of protection to prevent STIs (sexually transmitted infections).  Talk with your health care provider about taking a low-dose aspirin or statin.  Find healthy ways to cope with stress, such as: ? Meditation, yoga, or listening to music. ? Journaling. ? Talking to a trusted person. ? Spending time with friends and family. Safety  Always wear your seat belt while driving or riding in a vehicle.  Do not drive: ? If you have been drinking alcohol. Do not ride with someone who has been drinking. ? When you are tired or distracted. ? While texting.  Wear a helmet and other protective equipment during sports activities.  If you have firearms in your house, make sure you follow all gun safety procedures. What's next?  Visit your health care provider once a year for an annual wellness visit.  Ask your health care provider how often you should have your eyes and teeth checked.  Stay up to date on all vaccines. This information is not intended to replace advice given to you by your health care provider. Make sure you discuss any questions you have with your health care provider. Document Revised: 04/18/2019 Document Reviewed: 07/14/2018 Elsevier Patient Education  2021 [employer].

## 2020-10-17 NOTE — Progress Notes (Signed)
Established Patient Office Visit     This visit occurred during the SARS-CoV-2 public health emergency.  Safety protocols were in place, including screening questions prior to the visit, additional usage of staff PPE, and extensive cleaning of exam room while observing appropriate contact time as indicated for disinfecting solutions.    CC/Reason for Visit: Annual preventive exam and subsequent Medicare wellness visit  HPI: Alejandro Ward is a 70 y.o. male who is coming in today for the above mentioned reasons. Past Medical History is significant for: Polycythemia vera followed by Dr. Marin Olp he had his most recent phlebotomy at the beginning of March.  He has monthly hemoglobin/hematocrit checks through his office.  He continues to take his 81 mg daily of aspirin but has been doing really well.  He has no acute complaints today.  He has routine eye and dental care.  He is due for tetanus and shingles vaccines.  He is also due for colonoscopy.   Past Medical/Surgical History: No past medical history on file.  Past Surgical History:  Procedure Laterality Date  . NOSE SURGERY     broke as a child in football    Social History:  reports that he has never smoked. He has never used smokeless tobacco. He reports current alcohol use. No history on file for drug use.  Allergies: No Known Allergies  Family History:  Family History  Problem Relation Age of Onset  . Diabetes Mother   . Hypertension Mother   . Heart attack Father   . Hyperlipidemia Neg Hx   . Sudden death Neg Hx      Current Outpatient Medications:  .  aspirin 81 MG EC tablet, Take by mouth., Disp: , Rfl:   Review of Systems:  Constitutional: Denies fever, chills, diaphoresis, appetite change and fatigue.  HEENT: Denies photophobia, eye pain, redness, hearing loss, ear pain, congestion, sore throat, rhinorrhea, sneezing, mouth sores, trouble swallowing, neck pain, neck stiffness and tinnitus.    Respiratory: Denies SOB, DOE, cough, chest tightness,  and wheezing.   Cardiovascular: Denies chest pain, palpitations and leg swelling.  Gastrointestinal: Denies nausea, vomiting, abdominal pain, diarrhea, constipation, blood in stool and abdominal distention.  Genitourinary: Denies dysuria, urgency, frequency, hematuria, flank pain and difficulty urinating.  Endocrine: Denies: hot or cold intolerance, sweats, changes in hair or nails, polyuria, polydipsia. Musculoskeletal: Denies myalgias, back pain, joint swelling, arthralgias and gait problem.  Skin: Denies pallor, rash and wound.  Neurological: Denies dizziness, seizures, syncope, weakness, light-headedness, numbness and headaches.  Hematological: Denies adenopathy. Easy bruising, personal or family bleeding history  Psychiatric/Behavioral: Denies suicidal ideation, mood changes, confusion, nervousness, sleep disturbance and agitation    Physical Exam: Vitals:   10/17/20 0701  BP: 110/80  Pulse: 66  Temp: 97.8 F (36.6 C)  TempSrc: Oral  SpO2: 96%  Weight: 189 lb 8 oz (86 kg)  Height: 5\' 11"  (1.803 m)    Body mass index is 26.43 kg/m.   Constitutional: NAD, calm, comfortable Eyes: PERRL, lids and conjunctivae normal, wears corrective lenses ENMT: Mucous membranes are moist. Posterior pharynx clear of any exudate or lesions. Normal dentition. Tympanic membrane is pearly white, no erythema or bulging. Neck: normal, supple, no masses, no thyromegaly Respiratory: clear to auscultation bilaterally, no wheezing, no crackles. Normal respiratory effort. No accessory muscle use.  Cardiovascular: Regular rate and rhythm, no murmurs / rubs / gallops. No extremity edema. 2+ pedal pulses. Abdomen: no tenderness, no masses palpated. No hepatosplenomegaly. Bowel sounds positive.  Musculoskeletal: no clubbing / cyanosis. No joint deformity upper and lower extremities. Good ROM, no contractures. Normal muscle tone.  Skin: no rashes,  lesions, ulcers. No induration Neurologic: CN 2-12 grossly intact. Sensation intact, DTR normal. Strength 5/5 in all 4.  Psychiatric: Normal judgment and insight. Alert and oriented x 3. Normal mood.    Subsequent Medicare wellness visit   1. Risk factors, based on past  M,S,F -cardiovascular disease risk factors include age, gender, polycythemia vera albeit well-controlled   2.  Physical activities: He walks for 20 to 30 minutes every day   3.  Depression/mood:  Stable, not depressed   4.  Hearing:  Some decreased hearing but is not interested in audiology referral today   5.  ADL's: Independent in all ADLs   6.  Fall risk:  Low fall risk   7.  Home safety: No problems identified   8.  Height weight, and visual acuity: height and weight as above, vision:   Visual Acuity Screening   Right eye Left eye Both eyes  Without correction: 20/16 20/16 20/16   With correction:        9.  Counseling:  Advised to update vaccination status at pharmacy with shingles and tetanus   10. Lab orders based on risk factors: Laboratory update will be reviewed   11. Referral :  None today   12. Care plan:  Follow-up with me in 6 months   13. Cognitive assessment:  No cognitive impairment   14. Screening: Patient provided with a written and personalized 5-10 year screening schedule in the AVS.   yes   15. Provider List Update:   PCP, hematologist Dr. Marin Olp  16. Advance Directives: Full code   17. Opioids: Patient is not on any opioid prescriptions and has no risk factors for a substance use disorder.   [place] Office Visit from 04/19/2020 in [place] at [facility]  PHQ-9 Total Score 0      Fall Risk  10/17/2020 04/19/2020 07/25/2014 06/22/2014 05/31/2014  Falls in the past year? 0 0 No No No  Number falls in past yr: 0 0 - - -  Injury with Fall? 0 0 - - -  Risk for fall due to : - - Other (Comment) Other (Comment) Other (Comment)     Impression and  Plan:  Encounter for preventive health examination  -He has routine eye and dental care. -He has had Covid and influenza vaccine.  He is due for Tdap and shingles.  He will be due for his second pneumonia vaccine after September. -Screening labs. -Healthy lifestyle discussed in detail. -Refer to GI for screening colonoscopy.  Polycythemia vera ([place])  - Plan: CBC with Differential/Platelet -Followed by oncology, just had a phlebotomy earlier this month.  Screening for malignant neoplasm of colon  - Plan: Ambulatory referral to Gastroenterology    Patient Instructions   -Nice seeing you today!!  -Return fasting for labs.  -Remember tetanus and shingles vaccines at your pharmacy.  -GI referral for colonoscopy today.  -Schedule follow up in 6 months.   Preventive Care 85 Years and Older, Male Preventive care refers to lifestyle choices and visits with your health care provider that can promote health and wellness. This includes:  A yearly physical exam. This is also called an annual wellness visit.  Regular dental and eye exams.  Immunizations.  Screening for certain conditions.  Healthy lifestyle choices, such as: ? Eating a healthy diet. ? Getting regular exercise. ? Not using drugs  or products that contain nicotine and tobacco. ? Limiting alcohol use. What can I expect for my preventive care visit? Physical exam Your health care provider will check your:  Height and weight. These may be used to calculate your BMI (body mass index). BMI is a measurement that tells if you are at a healthy weight.  Heart rate and blood pressure.  Body temperature.  Skin for abnormal spots. Counseling Your health care provider may ask you questions about your:  Past medical problems.  Family's medical history.  Alcohol, tobacco, and drug use.  Emotional well-being.  Home life and relationship well-being.  Sexual activity.  Diet, exercise, and sleep  habits.  History of falls.  Memory and ability to understand (cognition).  Work and work Statistician.  Access to firearms. What immunizations do I need? Vaccines are usually given at various ages, according to a schedule. Your health care provider will recommend vaccines for you based on your age, medical history, and lifestyle or other factors, such as travel or where you work.   What tests do I need? Blood tests  Lipid and cholesterol levels. These may be checked every 5 years, or more often depending on your overall health.  Hepatitis C test.  Hepatitis B test. Screening  Lung cancer screening. You may have this screening every year starting at age 13 if you have a 30-pack-year history of smoking and currently smoke or have quit within the past 15 years.  Colorectal cancer screening. ? All adults should have this screening starting at age 61 and continuing until age 64. ? Your health care provider may recommend screening at age 61 if you are at increased risk. ? You will have tests every 1-10 years, depending on your results and the type of screening test.  Prostate cancer screening. Recommendations will vary depending on your family history and other risks.  Genital exam to check for testicular cancer or hernias.  Diabetes screening. ? This is done by checking your blood sugar (glucose) after you have not eaten for a while (fasting). ? You may have this done every 1-3 years.  Abdominal aortic aneurysm (AAA) screening. You may need this if you are a current or former smoker.  STD (sexually transmitted disease) testing, if you are at risk. Follow these instructions at home: Eating and drinking  Eat a diet that includes fresh fruits and vegetables, whole grains, lean protein, and low-fat dairy products. Limit your intake of foods with high amounts of sugar, saturated fats, and salt.  Take vitamin and mineral supplements as recommended by your health care provider.  Do  not drink alcohol if your health care provider tells you not to drink.  If you drink alcohol: ? Limit how much you have to 0-2 drinks a day. ? Be aware of how much alcohol is in your drink. In the [place] one drink equals one 12 oz bottle of beer (355 mL), one 5 oz glass of wine (148 mL), or one 1 oz glass of hard liquor (44 mL).   Lifestyle  Take daily care of your teeth and gums. Brush your teeth every morning and night with fluoride toothpaste. Floss one time each day.  Stay active. Exercise for at least 30 minutes 5 or more days each week.  Do not use any products that contain nicotine or tobacco, such as cigarettes, e-cigarettes, and chewing tobacco. If you need help quitting, ask your health care provider.  Do not use drugs.  If you are sexually active, practice  safe sex. Use a condom or other form of protection to prevent STIs (sexually transmitted infections).  Talk with your health care provider about taking a low-dose aspirin or statin.  Find healthy ways to cope with stress, such as: ? Meditation, yoga, or listening to music. ? Journaling. ? Talking to a trusted person. ? Spending time with friends and family. Safety  Always wear your seat belt while driving or riding in a vehicle.  Do not drive: ? If you have been drinking alcohol. Do not ride with someone who has been drinking. ? When you are tired or distracted. ? While texting.  Wear a helmet and other protective equipment during sports activities.  If you have firearms in your house, make sure you follow all gun safety procedures. What's next?  Visit your health care provider once a year for an annual wellness visit.  Ask your health care provider how often you should have your eyes and teeth checked.  Stay up to date on all vaccines. This information is not intended to replace advice given to you by your health care provider. Make sure you discuss any questions you have with your health care  provider. Document Revised: 04/18/2019 Document Reviewed: 07/14/2018 Elsevier Patient Education  2021 [place], MD [facility]

## 2020-11-03 ENCOUNTER — Encounter: Payer: Self-pay | Admitting: Family

## 2020-11-05 ENCOUNTER — Inpatient Hospital Stay: Payer: Medicare Other | Attending: Hematology & Oncology

## 2020-11-05 ENCOUNTER — Encounter: Payer: Self-pay | Admitting: Family

## 2020-11-05 ENCOUNTER — Inpatient Hospital Stay: Payer: Medicare Other

## 2020-11-05 ENCOUNTER — Other Ambulatory Visit: Payer: Self-pay

## 2020-11-05 ENCOUNTER — Other Ambulatory Visit: Payer: 59

## 2020-11-05 ENCOUNTER — Telehealth: Payer: Self-pay | Admitting: Hematology & Oncology

## 2020-11-05 DIAGNOSIS — D45 Polycythemia vera: Secondary | ICD-10-CM | POA: Insufficient documentation

## 2020-11-05 LAB — CMP (CANCER CENTER ONLY)
ALT: 22 U/L (ref 0–44)
AST: 20 U/L (ref 15–41)
Albumin: 4 g/dL (ref 3.5–5.0)
Alkaline Phosphatase: 75 U/L (ref 38–126)
Anion gap: 9 (ref 5–15)
BUN: 17 mg/dL (ref 8–23)
CO2: 22 mmol/L (ref 22–32)
Calcium: 8.5 mg/dL — ABNORMAL LOW (ref 8.9–10.3)
Chloride: 108 mmol/L (ref 98–111)
Creatinine: 1.3 mg/dL — ABNORMAL HIGH (ref 0.61–1.24)
GFR, Estimated: 59 mL/min — ABNORMAL LOW (ref >60)
Glucose, Bld: 113 mg/dL — ABNORMAL HIGH (ref 70–99)
Potassium: 4.3 mmol/L (ref 3.5–5.1)
Sodium: 139 mmol/L (ref 135–145)
Total Bilirubin: 0.6 mg/dL (ref 0.3–1.2)
Total Protein: 6.3 g/dL — ABNORMAL LOW (ref 6.5–8.1)

## 2020-11-05 LAB — CBC WITH DIFFERENTIAL (CANCER CENTER ONLY)
Abs Immature Granulocytes: 0.03 10*3/uL (ref 0.00–0.07)
Basophils Absolute: 0.2 10*3/uL — ABNORMAL HIGH (ref 0.0–0.1)
Basophils Relative: 3 %
Eosinophils Absolute: 0.4 10*3/uL (ref 0.0–0.5)
Eosinophils Relative: 4 %
HCT: 42.9 % (ref 39.0–52.0)
Hemoglobin: 12.3 g/dL — ABNORMAL LOW (ref 13.0–17.0)
Immature Granulocytes: 0 %
Lymphocytes Relative: 14 %
Lymphs Abs: 1.2 10*3/uL (ref 0.7–4.0)
MCH: 18.6 pg — ABNORMAL LOW (ref 26.0–34.0)
MCHC: 28.7 g/dL — ABNORMAL LOW (ref 30.0–36.0)
MCV: 64.8 fL — ABNORMAL LOW (ref 80.0–100.0)
Monocytes Absolute: 0.5 10*3/uL (ref 0.1–1.0)
Monocytes Relative: 6 %
Neutro Abs: 6.2 10*3/uL (ref 1.7–7.7)
Neutrophils Relative %: 73 %
Platelet Count: 609 10*3/uL — ABNORMAL HIGH (ref 150–400)
RBC: 6.62 MIL/uL — ABNORMAL HIGH (ref 4.22–5.81)
RDW: 19.9 % — ABNORMAL HIGH (ref 11.5–15.5)
WBC Count: 8.6 10*3/uL (ref 4.0–10.5)
nRBC: 0 % (ref 0.0–0.2)

## 2020-11-05 LAB — RETICULOCYTES
Immature Retic Fract: 17.1 % — ABNORMAL HIGH (ref 2.3–15.9)
RBC.: 6.65 MIL/uL — ABNORMAL HIGH (ref 4.22–5.81)
Retic Count, Absolute: 91.1 10*3/uL (ref 19.0–186.0)
Retic Ct Pct: 1.4 % (ref 0.4–3.1)

## 2020-11-05 LAB — IRON AND TIBC
Iron: 16 ug/dL — ABNORMAL LOW (ref 42–163)
Saturation Ratios: 4 % — ABNORMAL LOW (ref 20–55)
TIBC: 375 ug/dL (ref 202–409)
UIBC: 359 ug/dL (ref 117–376)

## 2020-11-05 LAB — LACTATE DEHYDROGENASE: LDH: 141 U/L (ref 98–192)

## 2020-11-05 LAB — FERRITIN: Ferritin: 5 ng/mL — ABNORMAL LOW (ref 24–336)

## 2020-11-05 LAB — SAVE SMEAR(SSMR), FOR PROVIDER SLIDE REVIEW

## 2020-11-05 NOTE — Telephone Encounter (Signed)
Scheduled follow-up appointment per 4/5 schedule message. Patient is aware. 

## 2020-12-03 ENCOUNTER — Other Ambulatory Visit: Payer: Self-pay

## 2020-12-03 ENCOUNTER — Other Ambulatory Visit: Payer: 59

## 2020-12-03 ENCOUNTER — Inpatient Hospital Stay: Payer: Medicare Other

## 2020-12-03 ENCOUNTER — Inpatient Hospital Stay: Payer: Medicare Other | Attending: Hematology & Oncology

## 2020-12-03 DIAGNOSIS — D45 Polycythemia vera: Secondary | ICD-10-CM | POA: Diagnosis not present

## 2020-12-03 LAB — CBC WITH DIFFERENTIAL (CANCER CENTER ONLY)
Abs Immature Granulocytes: 0.04 10*3/uL (ref 0.00–0.07)
Basophils Absolute: 0.2 10*3/uL — ABNORMAL HIGH (ref 0.0–0.1)
Basophils Relative: 2 %
Eosinophils Absolute: 0.3 10*3/uL (ref 0.0–0.5)
Eosinophils Relative: 3 %
HCT: 44.6 % (ref 39.0–52.0)
Hemoglobin: 12.8 g/dL — ABNORMAL LOW (ref 13.0–17.0)
Immature Granulocytes: 0 %
Lymphocytes Relative: 13 %
Lymphs Abs: 1.2 10*3/uL (ref 0.7–4.0)
MCH: 18.7 pg — ABNORMAL LOW (ref 26.0–34.0)
MCHC: 28.7 g/dL — ABNORMAL LOW (ref 30.0–36.0)
MCV: 65.1 fL — ABNORMAL LOW (ref 80.0–100.0)
Monocytes Absolute: 0.5 10*3/uL (ref 0.1–1.0)
Monocytes Relative: 5 %
Neutro Abs: 6.8 10*3/uL (ref 1.7–7.7)
Neutrophils Relative %: 77 %
Platelet Count: 564 10*3/uL — ABNORMAL HIGH (ref 150–400)
RBC: 6.85 MIL/uL — ABNORMAL HIGH (ref 4.22–5.81)
RDW: 20 % — ABNORMAL HIGH (ref 11.5–15.5)
WBC Count: 9.1 10*3/uL (ref 4.0–10.5)
nRBC: 0 % (ref 0.0–0.2)

## 2020-12-03 LAB — LACTATE DEHYDROGENASE: LDH: 157 U/L (ref 98–192)

## 2020-12-03 NOTE — Progress Notes (Signed)
Patient's hematocrit was 44.6 today. Patient's goal for phlebotomy procedures is to keep hematocrit less that 45. Laverna Peace, NP made aware and agreed that patient did not require phlebotomy. Patient reports feeling well and understands that he will not require a phlebotomy today.

## 2020-12-06 ENCOUNTER — Other Ambulatory Visit (HOSPITAL_BASED_OUTPATIENT_CLINIC_OR_DEPARTMENT_OTHER): Payer: 59

## 2020-12-29 ENCOUNTER — Encounter: Payer: Self-pay | Admitting: Family

## 2020-12-30 ENCOUNTER — Encounter: Payer: Self-pay | Admitting: Family

## 2020-12-31 ENCOUNTER — Telehealth (INDEPENDENT_AMBULATORY_CARE_PROVIDER_SITE_OTHER): Payer: Medicare Other | Admitting: Internal Medicine

## 2020-12-31 ENCOUNTER — Telehealth: Payer: Self-pay | Admitting: Internal Medicine

## 2020-12-31 ENCOUNTER — Encounter: Payer: Self-pay | Admitting: Family

## 2020-12-31 DIAGNOSIS — U071 COVID-19: Secondary | ICD-10-CM

## 2020-12-31 MED ORDER — NIRMATRELVIR/RITONAVIR (PAXLOVID)TABLET: 3.0000 | ORAL_TABLET | Freq: Two times a day (BID) | ORAL | 0 refills | Status: AC

## 2020-12-31 NOTE — Telephone Encounter (Signed)
Called patient to schedule a televisit with Dr. Jerilee Hoh. Patient left message stating that he tested positive for covid19 and wanted to schedule televisit to get paxlovid.  No answer, left voicemail message.

## 2020-12-31 NOTE — Telephone Encounter (Signed)
Patient scheduled.

## 2020-12-31 NOTE — Progress Notes (Signed)
Virtual Visit via Video Note  I connected with Alejandro Ward on 12/31/20 at 11:00 AM EDT by a video enabled telemedicine application and verified that I am speaking with the correct person using two identifiers.  Location patient: home Location provider: work office Persons participating in the virtual visit: patient, provider  I discussed the limitations of evaluation and management by telemedicine and the availability of in person appointments. The patient expressed understanding and agreed to proceed.   HPI: He is calling requesting treatment for COVID.  His wife went to visit their daughter out of state last week.  The day after she returned she was notified that several family members had tested positive.  Patient started developing symptoms last Saturday (4 days ago).  He describes a very sore throat, (feels like strep throat), significant coughing and body aches.  He has subsequently tested positive for COVID.  He has been taking ibuprofen and Delsym cough syrup.  His wife was prescribed Paxlovid by her physician and he is requesting the same.  He has a history of polycythemia vera and is managed by as needed phlebotomy.   ROS: Constitutional: Denies fever, chills, diaphoresis, appetite change and fatigue.  HEENT: Denies photophobia, eye pain, redness, hearing loss, mouth sores, trouble swallowing, neck pain, neck stiffness and tinnitus.   Respiratory: Denies SOB, DOE, cough, chest tightness,  and wheezing.   Cardiovascular: Denies chest pain, palpitations and leg swelling.  Gastrointestinal: Denies nausea, vomiting, abdominal pain, diarrhea, constipation, blood in stool and abdominal distention.  Genitourinary: Denies dysuria, urgency, frequency, hematuria, flank pain and difficulty urinating.  Endocrine: Denies: hot or cold intolerance, sweats, changes in hair or nails, polyuria, polydipsia. Musculoskeletal: Denies myalgias, back pain, joint swelling, arthralgias and  gait problem.  Skin: Denies pallor, rash and wound.  Neurological: Denies dizziness, seizures, syncope, weakness, light-headedness, numbness and headaches.  Hematological: Denies adenopathy. Easy bruising, personal or family bleeding history  Psychiatric/Behavioral: Denies suicidal ideation, mood changes, confusion, nervousness, sleep disturbance and agitation   No past medical history on file.  Past Surgical History:  Procedure Laterality Date  . NOSE SURGERY     broke as a child in football    Family History  Problem Relation Age of Onset  . Diabetes Mother   . Hypertension Mother   . Heart attack Father   . Hyperlipidemia Neg Hx   . Sudden death Neg Hx     SOCIAL HX:   reports that he has never smoked. He has never used smokeless tobacco. He reports current alcohol use. No history on file for drug use.   Current Outpatient Medications:  .  nirmatrelvir/ritonavir EUA (PAXLOVID) TABS, Take 3 tablets by mouth 2 (two) times daily for 5 days. Patient GFR is 60 Take nirmatrelvir (150 mg) two tablets twice daily for 5 days and ritonavir (100 mg) one tablet twice daily for 5 days., Disp: 30 tablet, Rfl: 0 .  aspirin 81 MG EC tablet, Take by mouth., Disp: , Rfl:   EXAM:   VITALS per patient if applicable: None reported  GENERAL: alert, oriented, appears well and in no acute distress  HEENT: atraumatic, conjunttiva clear, no obvious abnormalities on inspection of external nose and ears  NECK: normal movements of the head and neck  LUNGS: on inspection no signs of respiratory distress, breathing rate appears normal, no obvious gross increased work of breathing, gasping or wheezing  CV: no obvious cyanosis  MS: moves all visible extremities without noticeable abnormality  PSYCH/NEURO: pleasant and  cooperative, no obvious depression or anxiety, speech and thought processing grossly intact  ASSESSMENT AND PLAN:   COVID-19 - Plan: nirmatrelvir/ritonavir EUA (PAXLOVID)  TABS  -Paxlovid prescription has been sent. -Advised continued use of OTC cough syrup and pain relievers as needed. -He knows to reach out to me if symptoms do not improve.   I discussed the assessment and treatment plan with the patient. The patient was provided an opportunity to ask questions and all were answered. The patient agreed with the plan and demonstrated an understanding of the instructions.   The patient was advised to call back or seek an in-person evaluation if the symptoms worsen or if the condition fails to improve as anticipated.    Lelon Frohlich, MD  [facility] at [facility]

## 2021-01-01 ENCOUNTER — Telehealth: Payer: Self-pay

## 2021-01-01 NOTE — Telephone Encounter (Signed)
Called pt and left a vm with lab and phlebot per sch message as he is good to see [place] after day 10 covicd +

## 2021-01-02 ENCOUNTER — Telehealth: Payer: Self-pay | Admitting: *Deleted

## 2021-01-02 ENCOUNTER — Encounter: Payer: Self-pay | Admitting: Family

## 2021-01-02 NOTE — Telephone Encounter (Signed)
Patient requested to have lab & phlebotomy @ [facility]

## 2021-01-03 ENCOUNTER — Ambulatory Visit (HOSPITAL_BASED_OUTPATIENT_CLINIC_OR_DEPARTMENT_OTHER): Payer: Medicare Other

## 2021-01-07 ENCOUNTER — Inpatient Hospital Stay: Payer: Medicare Other

## 2021-01-07 ENCOUNTER — Inpatient Hospital Stay: Payer: Medicare Other | Admitting: Hematology & Oncology

## 2021-01-07 ENCOUNTER — Telehealth: Payer: Self-pay

## 2021-01-07 NOTE — Telephone Encounter (Signed)
Called pt and he is aware of his appts on 6/9 at [facility] and we confirmed his 6/21 appts   Ayannah Faddis

## 2021-01-09 ENCOUNTER — Other Ambulatory Visit: Payer: Self-pay

## 2021-01-09 ENCOUNTER — Inpatient Hospital Stay: Payer: Medicare Other | Attending: Hematology & Oncology

## 2021-01-09 ENCOUNTER — Inpatient Hospital Stay: Payer: Medicare Other

## 2021-01-09 ENCOUNTER — Other Ambulatory Visit: Payer: Medicare Other

## 2021-01-09 VITALS — BP 130/78 | HR 60 | Temp 98.2°F | Resp 17

## 2021-01-09 DIAGNOSIS — D45 Polycythemia vera: Secondary | ICD-10-CM | POA: Insufficient documentation

## 2021-01-09 LAB — CBC WITH DIFFERENTIAL (CANCER CENTER ONLY)
Abs Immature Granulocytes: 0.15 K/uL — ABNORMAL HIGH (ref 0.00–0.07)
Basophils Absolute: 0.2 K/uL — ABNORMAL HIGH (ref 0.0–0.1)
Basophils Relative: 2 %
Eosinophils Absolute: 0.3 K/uL (ref 0.0–0.5)
Eosinophils Relative: 3 %
HCT: 45.4 % (ref 39.0–52.0)
Hemoglobin: 13 g/dL (ref 13.0–17.0)
Immature Granulocytes: 2 %
Lymphocytes Relative: 12 %
Lymphs Abs: 1.1 K/uL (ref 0.7–4.0)
MCH: 18.8 pg — ABNORMAL LOW (ref 26.0–34.0)
MCHC: 28.6 g/dL — ABNORMAL LOW (ref 30.0–36.0)
MCV: 65.5 fL — ABNORMAL LOW (ref 80.0–100.0)
Monocytes Absolute: 0.6 K/uL (ref 0.1–1.0)
Monocytes Relative: 7 %
Neutro Abs: 6.7 K/uL (ref 1.7–7.7)
Neutrophils Relative %: 74 %
Platelet Count: 701 K/uL — ABNORMAL HIGH (ref 150–400)
RBC: 6.93 MIL/uL — ABNORMAL HIGH (ref 4.22–5.81)
RDW: 20.9 % — ABNORMAL HIGH (ref 11.5–15.5)
WBC Count: 9 K/uL (ref 4.0–10.5)
nRBC: 0 % (ref 0.0–0.2)

## 2021-01-09 LAB — LACTATE DEHYDROGENASE: LDH: 151 U/L (ref 98–192)

## 2021-01-09 NOTE — Progress Notes (Signed)
Alejandro Ward presents today for phlebotomy per MD orders.  Hct today is 45.4 Phlebotomy procedure started at 0813 and ended at 0823.  16 g to R antecubital,  517 grams removed. Patient observed for 15 minutes after procedure without any incident (per patient request). Patient tolerated procedure well, food & drink given. IV needle removed intact.

## 2021-01-09 NOTE — Patient Instructions (Signed)

## 2021-01-20 ENCOUNTER — Other Ambulatory Visit: Payer: Self-pay | Admitting: *Deleted

## 2021-01-20 DIAGNOSIS — D469 Myelodysplastic syndrome, unspecified: Secondary | ICD-10-CM

## 2021-01-20 DIAGNOSIS — D500 Iron deficiency anemia secondary to blood loss (chronic): Secondary | ICD-10-CM

## 2021-01-20 DIAGNOSIS — D45 Polycythemia vera: Secondary | ICD-10-CM

## 2021-01-21 ENCOUNTER — Inpatient Hospital Stay: Payer: Medicare Other

## 2021-01-21 ENCOUNTER — Telehealth: Payer: Self-pay | Admitting: Pharmacy Technician

## 2021-01-21 ENCOUNTER — Encounter: Payer: Self-pay | Admitting: Family

## 2021-01-21 ENCOUNTER — Other Ambulatory Visit: Payer: Self-pay

## 2021-01-21 ENCOUNTER — Telehealth: Payer: Self-pay | Admitting: Pharmacist

## 2021-01-21 ENCOUNTER — Other Ambulatory Visit (HOSPITAL_COMMUNITY): Payer: Self-pay

## 2021-01-21 ENCOUNTER — Inpatient Hospital Stay (HOSPITAL_BASED_OUTPATIENT_CLINIC_OR_DEPARTMENT_OTHER): Payer: Medicare Other | Admitting: Hematology & Oncology

## 2021-01-21 ENCOUNTER — Encounter: Payer: Self-pay | Admitting: Hematology & Oncology

## 2021-01-21 ENCOUNTER — Other Ambulatory Visit: Payer: Medicare Other

## 2021-01-21 ENCOUNTER — Ambulatory Visit (HOSPITAL_BASED_OUTPATIENT_CLINIC_OR_DEPARTMENT_OTHER)
Admission: RE | Admit: 2021-01-21 | Discharge: 2021-01-21 | Disposition: A | Discharge status: Home / Self Care | Payer: Medicare Other | Source: Ambulatory Visit | Attending: Hematology & Oncology | Admitting: Hematology & Oncology

## 2021-01-21 ENCOUNTER — Telehealth: Payer: Self-pay

## 2021-01-21 VITALS — BP 122/72 | HR 52 | Temp 97.7°F | Resp 18 | Wt 184.2 lb

## 2021-01-21 DIAGNOSIS — D45 Polycythemia vera: Secondary | ICD-10-CM

## 2021-01-21 DIAGNOSIS — D500 Iron deficiency anemia secondary to blood loss (chronic): Secondary | ICD-10-CM

## 2021-01-21 DIAGNOSIS — D469 Myelodysplastic syndrome, unspecified: Secondary | ICD-10-CM

## 2021-01-21 LAB — CMP (CANCER CENTER ONLY)
ALT: 23 U/L (ref 0–44)
AST: 25 U/L (ref 15–41)
Albumin: 4.5 g/dL (ref 3.5–5.0)
Alkaline Phosphatase: 76 U/L (ref 38–126)
Anion gap: 6 (ref 5–15)
BUN: 19 mg/dL (ref 8–23)
CO2: 30 mmol/L (ref 22–32)
Calcium: 9.7 mg/dL (ref 8.9–10.3)
Chloride: 102 mmol/L (ref 98–111)
Creatinine: 1.23 mg/dL (ref 0.61–1.24)
GFR, Estimated: >60 mL/min (ref >60)
Glucose, Bld: 95 mg/dL (ref 70–99)
Potassium: 4.1 mmol/L (ref 3.5–5.1)
Sodium: 138 mmol/L (ref 135–145)
Total Bilirubin: 0.6 mg/dL (ref 0.3–1.2)
Total Protein: 6.5 g/dL (ref 6.5–8.1)

## 2021-01-21 LAB — CBC WITH DIFFERENTIAL (CANCER CENTER ONLY)
Abs Immature Granulocytes: 0.14 10*3/uL — ABNORMAL HIGH (ref 0.00–0.07)
Basophils Absolute: 0.3 10*3/uL — ABNORMAL HIGH (ref 0.0–0.1)
Basophils Relative: 3 %
Eosinophils Absolute: 0.3 10*3/uL (ref 0.0–0.5)
Eosinophils Relative: 2 %
HCT: 44.1 % (ref 39.0–52.0)
Hemoglobin: 12.5 g/dL — ABNORMAL LOW (ref 13.0–17.0)
Immature Granulocytes: 1 %
Lymphocytes Relative: 13 %
Lymphs Abs: 1.4 10*3/uL (ref 0.7–4.0)
MCH: 19.2 pg — ABNORMAL LOW (ref 26.0–34.0)
MCHC: 28.3 g/dL — ABNORMAL LOW (ref 30.0–36.0)
MCV: 67.6 fL — ABNORMAL LOW (ref 80.0–100.0)
Monocytes Absolute: 0.6 10*3/uL (ref 0.1–1.0)
Monocytes Relative: 6 %
Neutro Abs: 7.7 10*3/uL (ref 1.7–7.7)
Neutrophils Relative %: 75 %
Platelet Count: 798 10*3/uL — ABNORMAL HIGH (ref 150–400)
RBC: 6.52 MIL/uL — ABNORMAL HIGH (ref 4.22–5.81)
RDW: 21.3 % — ABNORMAL HIGH (ref 11.5–15.5)
WBC Count: 10.3 10*3/uL (ref 4.0–10.5)
nRBC: 0 % (ref 0.0–0.2)

## 2021-01-21 LAB — RETICULOCYTES
Immature Retic Fract: 17.9 % — ABNORMAL HIGH (ref 2.3–15.9)
RBC.: 6.54 MIL/uL — ABNORMAL HIGH (ref 4.22–5.81)
Retic Count, Absolute: 93.5 10*3/uL (ref 19.0–186.0)
Retic Ct Pct: 1.4 % (ref 0.4–3.1)

## 2021-01-21 LAB — LACTATE DEHYDROGENASE: LDH: 151 U/L (ref 98–192)

## 2021-01-21 MED ORDER — RUXOLITINIB PHOSPHATE 10 MG PO TABS
10.0000 mg | ORAL_TABLET | Freq: Two times a day (BID) | ORAL | 6 refills | Status: DC
  Filled 2021-01-21: qty 60, 30d supply, fill #0

## 2021-01-21 MED ORDER — RUXOLITINIB PHOSPHATE 20 MG PO TABS
20.0000 mg | ORAL_TABLET | Freq: Two times a day (BID) | ORAL | 6 refills | Status: DC
  Filled 2021-01-21: qty 60, 30d supply, fill #0

## 2021-01-21 NOTE — Telephone Encounter (Signed)
Oral Oncology Patient Advocate Encounter   Received notification from [facility] that prior authorization for Shanon Brow is required.   PA submitted on CoverMyMeds Key [id number] Status is pending   Oral Oncology Clinic will continue to follow.

## 2021-01-21 NOTE — Progress Notes (Signed)
Hematology and Oncology Follow Up Visit  Alejandro Ward [id number] 13-Dec-1950 70 y.o. 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  Unfortunately, I think were going to have to get him on some medication because his platelet count is starting to go up.  Today, his platelet count is almost 800,000.  The platelet count has been going up gradually.  Because he is JAK2 positive, I think it be worthwhile to get him on Jakafi.  I think this should work.  I think he would be able to tolerate this.  He has not had any problems with abdominal pain.  He has had no change in bowel or bladder habits.   He did have a splenic ultrasound today.  The spleen had a volume of 1263 cm.  This we can definitely use as a guide.  He has had no rashes.  He has had no cough or shortness of breath.  He did have COVID a few weeks ago.  His wife brought her back from [place].  He really did not have a lot of symptoms.  Currently, his performance status is ECOG 0.     Medications:  Allergies as of 01/21/2021   No Known Allergies      Medication List        Accurate as of January 21, 2021 12:21 PM. If you have any questions, ask your nurse or doctor.          aspirin 81 MG EC tablet Take by mouth.        Allergies: No Known Allergies  Past Medical History, Surgical history, Social history, and Family History were reviewed and updated.  Review of Systems: Review of Systems  Constitutional: Negative.   HENT: Negative.    Eyes: Negative.   Respiratory: Negative.    Cardiovascular: Negative.   Gastrointestinal: Negative.   Genitourinary: Negative.   Musculoskeletal: Negative.   Skin: Negative.   Neurological: Negative.   Endo/Heme/Allergies:  Negative.   Psychiatric/Behavioral: Negative.     Physical Exam:  weight is 184 lb 4 oz (83.6 kg). His oral temperature is 97.7 F (36.5 C). His blood pressure is 122/72 and his pulse is 52 (abnormal). His respiration is 18.   Wt Readings from Last 3 Encounters:  01/21/21 184 lb 4 oz (83.6 kg)  10/17/20 189 lb 8 oz (86 kg)  10/01/20 190 lb (86.2 kg)    Physical Exam Vitals reviewed.  HENT:     Head: Normocephalic and atraumatic.  Eyes:     Pupils: Pupils are equal, round, and reactive to light.  Cardiovascular:     Rate and Rhythm: Normal rate and regular rhythm.     Heart sounds: Normal heart sounds.  Pulmonary:     Effort: Pulmonary effort is normal.     Breath sounds: Normal breath sounds.  Abdominal:     General: Bowel sounds are normal.     Palpations: Abdomen is soft.     Comments: Abdominal exam shows a soft abdomen.  He has good bowel sounds.  There is no guarding or rebound tenderness.  I cannot palpate his liver tip or splenic edge.  Musculoskeletal:        General:  No tenderness or deformity. Normal range of motion.     Cervical back: Normal range of motion.  Lymphadenopathy:     Cervical: No cervical adenopathy.  Skin:    General: Skin is warm and dry.     Findings: No erythema or rash.  Neurological:     Mental Status: He is alert and oriented to person, place, and time.  Psychiatric:        Behavior: Behavior normal.        Thought Content: Thought content normal.        Judgment: Judgment normal.     Lab Results  Component Value Date   WBC 10.3 01/21/2021   HGB 12.5 (L) 01/21/2021   HCT 44.1 01/21/2021   MCV 67.6 (L) 01/21/2021   PLT 798 (H) 01/21/2021   Lab Results  Component Value Date   FERRITIN 5 (L) 11/05/2020   IRON 16 (L) 11/05/2020   TIBC 375 11/05/2020   UIBC 359 11/05/2020   IRONPCTSAT 4 (L) 11/05/2020   Lab Results  Component Value Date   RETICCTPCT 1.4 01/21/2021   RBC 6.54 (H) 01/21/2021   No results found for:  KPAFRELGTCHN, LAMBDASER, KAPLAMBRATIO No results found for: IGGSERUM, IGA, IGMSERUM No results found for: Ronnald Ramp, A1GS, A2GS, Tillman Sers, SPEI   Chemistry      Component Value Date/Time   NA 138 01/21/2021 1010   K 4.1 01/21/2021 1010   CL 102 01/21/2021 1010   CO2 30 01/21/2021 1010   BUN 19 01/21/2021 1010   CREATININE 1.23 01/21/2021 1010      Component Value Date/Time   CALCIUM 9.7 01/21/2021 1010   ALKPHOS 76 01/21/2021 1010   AST 25 01/21/2021 1010   ALT 23 01/21/2021 1010   BILITOT 0.6 01/21/2021 1010       Impression and Plan: Alejandro Ward is a very pleasant 70 yo caucasian gentleman with polycythemia vera, JAK 2 positive.   We will go ahead and start him on Jakafi.  I think this will work.  I think side effects would be tolerable.  I told him to watch out for the possibility of diarrhea.  He may have a little bit of a rash.  Blood watch out for fatigue and weakness if his hemoglobin drops.  He is agreeable to the [facility].  I will send this into our pharmacists and they will be able to figure out how much it will cost.  Hopefully it will not be prohibitive in cost.  I would like to see him back in about 4 weeks or so.    Volanda Napoleon, MD 6/21/202212:21 PM

## 2021-01-21 NOTE — Telephone Encounter (Signed)
Appts made per 01/21/21 los and pt will gain sch at [employer] and through [employer]

## 2021-01-22 LAB — IRON AND TIBC
Iron: 16 ug/dL — ABNORMAL LOW (ref 42–163)
Saturation Ratios: 4 % — ABNORMAL LOW (ref 20–55)
TIBC: 371 ug/dL (ref 202–409)
UIBC: 355 ug/dL (ref 117–376)

## 2021-01-22 LAB — FERRITIN: Ferritin: 5 ng/mL — ABNORMAL LOW (ref 24–336)

## 2021-01-22 NOTE — Telephone Encounter (Signed)
Oral Oncology Patient Advocate Encounter  Received notification from [facility] that the request for prior authorization for Shanon Brow has been denied due to patient not having inadequate response or intolerance to interferon therapy or hydoxyurea.    This encounter will continue to be updated until final determination.    [place] Patient [place] Phone [phone] Fax [phone] 01/22/2021 9:42 AM

## 2021-01-24 ENCOUNTER — Encounter: Payer: Self-pay | Admitting: Hematology & Oncology

## 2021-01-27 ENCOUNTER — Other Ambulatory Visit (HOSPITAL_COMMUNITY): Payer: Self-pay

## 2021-01-27 ENCOUNTER — Telehealth: Payer: Self-pay | Admitting: *Deleted

## 2021-01-27 ENCOUNTER — Encounter: Payer: Self-pay | Admitting: Family

## 2021-01-27 NOTE — Telephone Encounter (Signed)
Oral Oncology Pharmacist Encounter  Received new prescription for Jakafi (ruxolitinib) for the treatment of polycythemia vera, planned duration until disease progression or unacceptable drug toxicity.  CBC/CMP from 01/21/21 assessed, no relevant lab abnormalities. Patient is borderline dose adjustment based on CrCl, if SCr increases may need to consider dose adjustment down to 5mg  twice daily. Prescription dose and frequency assessed.   Current medication list in Epic reviewed, no DDIs with ruxolitinib identified.  Evaluated chart and no patient barriers to medication adherence identified.   Prescription has been e-scribed to the [facility] for benefits analysis and approval.  Oral Oncology Clinic will continue to follow for insurance authorization, copayment issues, initial counseling and start date.  Patient agreed to treatment on 01/21/21 per MD documentation.  Darl Pikes, PharmD, BCPS, BCOP, CPP Hematology/Oncology Clinical Pharmacist Practitioner [facility] [phone]  01/27/2021 9:28 AM

## 2021-01-27 NOTE — Telephone Encounter (Signed)
Called patient and lvm that appointment has been rescheduled as requested. Requested a call back to confirm appointment.

## 2021-01-28 ENCOUNTER — Encounter: Payer: Self-pay | Admitting: Family

## 2021-01-29 ENCOUNTER — Encounter: Payer: Self-pay | Admitting: Family

## 2021-01-29 ENCOUNTER — Other Ambulatory Visit: Payer: Self-pay | Admitting: *Deleted

## 2021-01-29 DIAGNOSIS — D45 Polycythemia vera: Secondary | ICD-10-CM

## 2021-01-29 NOTE — Telephone Encounter (Signed)
Please let me know when to schedule

## 2021-01-30 ENCOUNTER — Encounter: Payer: Self-pay | Admitting: Internal Medicine

## 2021-01-30 ENCOUNTER — Encounter: Payer: Self-pay | Admitting: Family

## 2021-01-30 ENCOUNTER — Telehealth: Payer: Self-pay | Admitting: *Deleted

## 2021-01-30 DIAGNOSIS — Z1283 Encounter for screening for malignant neoplasm of skin: Secondary | ICD-10-CM

## 2021-01-30 NOTE — Telephone Encounter (Signed)
Spoke w/patient regarding start date of Jakafi--Dr. Benay Spice will see him 2-3 weeks after starting this as new patient. Alejandro Ward reports speaking with hematologist, Alejandro Ward in [place] and Hydrea 500 mg daily with weekly labs instead. Forwarded this to Dr. Benay Spice for orders.

## 2021-01-31 ENCOUNTER — Encounter: Payer: Self-pay | Admitting: Hematology & Oncology

## 2021-01-31 ENCOUNTER — Telehealth: Payer: Self-pay | Admitting: Oncology

## 2021-01-31 NOTE — Telephone Encounter (Signed)
Alejandro Ward has been scheduled to see Dr.Sherrill on 7/19 at 8am w/labs at 7:45am

## 2021-02-04 ENCOUNTER — Other Ambulatory Visit (HOSPITAL_COMMUNITY): Payer: Self-pay

## 2021-02-04 NOTE — Telephone Encounter (Signed)
Received notification from [facility] that the request for prior authorization for Shanon Brow has been denied due to patient not having inadequate response or intolerance to interferon therapy or hydroxyurea. MD notified.

## 2021-02-11 ENCOUNTER — Other Ambulatory Visit: Payer: Self-pay

## 2021-02-11 ENCOUNTER — Inpatient Hospital Stay: Payer: Medicare Other | Attending: Hematology & Oncology

## 2021-02-11 DIAGNOSIS — D45 Polycythemia vera: Secondary | ICD-10-CM | POA: Insufficient documentation

## 2021-02-11 DIAGNOSIS — D75838 Other thrombocytosis: Secondary | ICD-10-CM | POA: Insufficient documentation

## 2021-02-11 DIAGNOSIS — E611 Iron deficiency: Secondary | ICD-10-CM | POA: Diagnosis not present

## 2021-02-11 LAB — CBC WITH DIFFERENTIAL (CANCER CENTER ONLY)
Abs Immature Granulocytes: 0.03 10*3/uL (ref 0.00–0.07)
Basophils Absolute: 0.2 10*3/uL — ABNORMAL HIGH (ref 0.0–0.1)
Basophils Relative: 2 %
Eosinophils Absolute: 0.3 10*3/uL (ref 0.0–0.5)
Eosinophils Relative: 4 %
HCT: 44.3 % (ref 39.0–52.0)
Hemoglobin: 12.6 g/dL — ABNORMAL LOW (ref 13.0–17.0)
Immature Granulocytes: 0 %
Lymphocytes Relative: 14 %
Lymphs Abs: 1.1 10*3/uL (ref 0.7–4.0)
MCH: 19.2 pg — ABNORMAL LOW (ref 26.0–34.0)
MCHC: 28.4 g/dL — ABNORMAL LOW (ref 30.0–36.0)
MCV: 67.4 fL — ABNORMAL LOW (ref 80.0–100.0)
Monocytes Absolute: 0.5 10*3/uL (ref 0.1–1.0)
Monocytes Relative: 6 %
Neutro Abs: 6.2 10*3/uL (ref 1.7–7.7)
Neutrophils Relative %: 74 %
Platelet Count: 514 10*3/uL — ABNORMAL HIGH (ref 150–400)
RBC: 6.57 MIL/uL — ABNORMAL HIGH (ref 4.22–5.81)
RDW: 21.2 % — ABNORMAL HIGH (ref 11.5–15.5)
WBC Count: 8.4 10*3/uL (ref 4.0–10.5)
nRBC: 0 % (ref 0.0–0.2)

## 2021-02-11 LAB — CMP (CANCER CENTER ONLY)
ALT: 18 U/L (ref 0–44)
AST: 21 U/L (ref 15–41)
Albumin: 4.1 g/dL (ref 3.5–5.0)
Alkaline Phosphatase: 69 U/L (ref 38–126)
Anion gap: 6 (ref 5–15)
BUN: 16 mg/dL (ref 8–23)
CO2: 27 mmol/L (ref 22–32)
Calcium: 9 mg/dL (ref 8.9–10.3)
Chloride: 106 mmol/L (ref 98–111)
Creatinine: 1.1 mg/dL (ref 0.61–1.24)
GFR, Estimated: >60 mL/min (ref >60)
Glucose, Bld: 80 mg/dL (ref 70–99)
Potassium: 4 mmol/L (ref 3.5–5.1)
Sodium: 139 mmol/L (ref 135–145)
Total Bilirubin: 0.6 mg/dL (ref 0.3–1.2)
Total Protein: 6 g/dL — ABNORMAL LOW (ref 6.5–8.1)

## 2021-02-11 LAB — RETICULOCYTES
Immature Retic Fract: 15.2 % (ref 2.3–15.9)
RBC.: 6.53 MIL/uL — ABNORMAL HIGH (ref 4.22–5.81)
Retic Count, Absolute: 5.9 10*3/uL — ABNORMAL LOW (ref 19.0–186.0)
Retic Ct Pct: 0.9 % (ref 0.4–3.1)

## 2021-02-11 LAB — FERRITIN: Ferritin: 4 ng/mL — ABNORMAL LOW (ref 24–336)

## 2021-02-11 LAB — IRON AND TIBC
Iron: 17 ug/dL — ABNORMAL LOW (ref 45–182)
Saturation Ratios: 5 % — ABNORMAL LOW (ref 17.9–39.5)
TIBC: 375 ug/dL (ref 250–450)
UIBC: 358 ug/dL

## 2021-02-11 LAB — SAVE SMEAR(SSMR), FOR PROVIDER SLIDE REVIEW

## 2021-02-11 LAB — LACTATE DEHYDROGENASE: LDH: 115 U/L (ref 98–192)

## 2021-02-13 ENCOUNTER — Other Ambulatory Visit: Payer: Self-pay | Admitting: *Deleted

## 2021-02-13 DIAGNOSIS — D45 Polycythemia vera: Secondary | ICD-10-CM

## 2021-02-17 ENCOUNTER — Telehealth: Payer: Self-pay | Admitting: *Deleted

## 2021-02-17 ENCOUNTER — Ambulatory Visit: Payer: Medicare Other | Admitting: Oncology

## 2021-02-17 ENCOUNTER — Encounter: Payer: Self-pay | Admitting: Oncology

## 2021-02-17 ENCOUNTER — Other Ambulatory Visit: Payer: Medicare Other

## 2021-02-17 NOTE — Telephone Encounter (Signed)
Left VM reminder call re: lab/OV with Dr.Sherrill on 02/18/21 at [place]. Requested return call to confirm.

## 2021-02-18 ENCOUNTER — Other Ambulatory Visit: Payer: Medicare Other

## 2021-02-18 ENCOUNTER — Encounter: Payer: Self-pay | Admitting: Family

## 2021-02-18 ENCOUNTER — Inpatient Hospital Stay (HOSPITAL_BASED_OUTPATIENT_CLINIC_OR_DEPARTMENT_OTHER): Payer: Medicare Other | Admitting: Oncology

## 2021-02-18 ENCOUNTER — Ambulatory Visit: Payer: Medicare Other | Admitting: Oncology

## 2021-02-18 ENCOUNTER — Telehealth: Payer: Self-pay | Admitting: Oncology

## 2021-02-18 ENCOUNTER — Other Ambulatory Visit: Payer: Self-pay

## 2021-02-18 ENCOUNTER — Ambulatory Visit: Payer: Medicare Other | Admitting: Family

## 2021-02-18 ENCOUNTER — Inpatient Hospital Stay: Payer: Medicare Other

## 2021-02-18 VITALS — BP 120/71 | HR 60 | Temp 97.8°F | Resp 18 | Ht 71.0 in | Wt 187.2 lb

## 2021-02-18 DIAGNOSIS — D45 Polycythemia vera: Secondary | ICD-10-CM

## 2021-02-18 LAB — CBC WITH DIFFERENTIAL (CANCER CENTER ONLY)
Abs Immature Granulocytes: 0.04 10*3/uL (ref 0.00–0.07)
Basophils Absolute: 0.3 10*3/uL — ABNORMAL HIGH (ref 0.0–0.1)
Basophils Relative: 3 %
Eosinophils Absolute: 0.3 10*3/uL (ref 0.0–0.5)
Eosinophils Relative: 3 %
HCT: 44.4 % (ref 39.0–52.0)
Hemoglobin: 12.6 g/dL — ABNORMAL LOW (ref 13.0–17.0)
Immature Granulocytes: 1 %
Lymphocytes Relative: 15 %
Lymphs Abs: 1.3 10*3/uL (ref 0.7–4.0)
MCH: 19.1 pg — ABNORMAL LOW (ref 26.0–34.0)
MCHC: 28.4 g/dL — ABNORMAL LOW (ref 30.0–36.0)
MCV: 67.4 fL — ABNORMAL LOW (ref 80.0–100.0)
Monocytes Absolute: 0.5 10*3/uL (ref 0.1–1.0)
Monocytes Relative: 6 %
Neutro Abs: 6.3 10*3/uL (ref 1.7–7.7)
Neutrophils Relative %: 72 %
Platelet Count: 577 10*3/uL — ABNORMAL HIGH (ref 150–400)
RBC: 6.59 MIL/uL — ABNORMAL HIGH (ref 4.22–5.81)
RDW: 21.1 % — ABNORMAL HIGH (ref 11.5–15.5)
WBC Count: 8.7 10*3/uL (ref 4.0–10.5)
nRBC: 0 % (ref 0.0–0.2)

## 2021-02-18 NOTE — Progress Notes (Signed)
[place] OFFICE PROGRESS NOTE   Diagnosis: Polycythemia vera  INTERVAL HISTORY:   Alejandro Ward has a history of polycythemia vera.  He has been followed by Dr. Marin Olp.  He is transferring care to the [place] [facility] due to proximity to his home.  He reports being diagnosed with polycythemia vera, JAK2 positive, n late 2019 when he was noted to have a hemoglobin in the 20s and hematocrit of 66%.  He was treated with phlebotomy therapy.  He was living [place] and [place] prior to relocating to [place] approximately 1 year ago.  He saw Dr. Marin Olp.  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  Storage iron was diminished to absent.  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  He continued aspirin and phlebotomy therapy with a goal hematocrit of less than 45%.  When he saw Dr. Marin Olp in June the platelet count was more elevated.  Alejandro Ward was prescribed.  His insurance would not approve Jakafi.  He was prescribed hydroxyurea by his hematologist in [place].  He started hydroxyurea at a dose of 500 mg daily on 02/08/2021.  He reports nausea, diarrhea, and pruritus beginning a few days after starting hydroxyurea.  No emesis.  He discontinued hydroxyurea after approximately 5 days.  Past medical history: Right leg cellulitis in 2020 Polycythemia vera diagnosed in late 2019 COVID-19 infection May 2022  Surgical history: Tonsillectomy as a child  Family history: No family history of cancer or a hematologic condition  Social history: He lives with his wife and daughter in [place].  He works as an Forensic psychologist.  He does not use cigarettes.  Rare alcohol use.  No transfusion history.  No risk factor for HIV or hepatitis.  He has received COVID-19 vaccines.  Review of systems: Negative  Objective:  Vital signs in last 24 hours:  Blood pressure 120/71, pulse 60,  temperature 97.8 F (36.6 C), temperature source Oral, resp. rate 18, height _0  (1.803 m), weight 187 lb 3.2 oz (84.9 kg), SpO2 99 %.    HEENT: No thrush or ulcers Resp: Lungs clear bilaterally Cardio: Regular rate and rhythm GI: No hepatosplenomegaly Vascular: No leg edema  Skin: No rubra or of the face or head, no erythromelalgia  Lab Results:  Lab Results  Component Value Date   WBC 8.7 02/18/2021   HGB 12.6 (L) 02/18/2021   HCT 44.4 02/18/2021   MCV 67.4 (L) 02/18/2021   PLT 577 (H) 02/18/2021   NEUTROABS 6.3 02/18/2021    CMP  Lab Results  Component Value Date   NA 139 02/11/2021   K 4.0 02/11/2021   CL 106 02/11/2021   CO2 27 02/11/2021   GLUCOSE 80 02/11/2021   BUN 16 02/11/2021   CREATININE 1.10 02/11/2021   CALCIUM 9.0 02/11/2021   PROT 6.0 (L) 02/11/2021   ALBUMIN 4.1 02/11/2021   AST 21 02/11/2021   ALT 18 02/11/2021   ALKPHOS 69 02/11/2021   BILITOT 0.6 02/11/2021   GFRNONAA >60 02/11/2021   GFRAA >60 04/16/2020     Medications: I have reviewed the patient's current medications.   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  The hemoglobin is in goal range at present.  He was started hydroxyurea for control of thrombocytosis in addition to the elevated hemoglobin.  He took hydroxyurea for approximately 5 days and experienced nausea and diarrhea.  I discussed  treatment options with Alejandro Ward and his wife.  They understand the thrombocytosis may be related to the polycythemia or iron deficiency.  He is taking aspirin.  We discussed resuming hydroxyurea, interferon, and Jakafi therapy.  He agrees to a repeat trial of hydroxyurea.  He will take hydroxyurea at a dose of 500 mg every other day.  He will return for an office and lab visit in 2 weeks.  He will contact us for recurrent nausea or diarrhea.  The plan is to request insurance approval for [facility] if he is unable to tolerate hydroxyurea.  He will be scheduled for an office and lab visit in 1 month.  Betsy Coder, MD  02/18/2021  1:30 PM

## 2021-02-18 NOTE — Telephone Encounter (Signed)
Scheduled apt per 7/19 los - called pt and he is aware of appts scheduled

## 2021-02-19 ENCOUNTER — Ambulatory Visit: Payer: Medicare Other | Admitting: Family

## 2021-02-19 ENCOUNTER — Inpatient Hospital Stay: Payer: Medicare Other

## 2021-03-05 ENCOUNTER — Other Ambulatory Visit: Payer: Self-pay

## 2021-03-05 ENCOUNTER — Inpatient Hospital Stay: Payer: Medicare Other | Attending: Hematology & Oncology

## 2021-03-05 DIAGNOSIS — D75838 Other thrombocytosis: Secondary | ICD-10-CM | POA: Diagnosis not present

## 2021-03-05 DIAGNOSIS — D45 Polycythemia vera: Secondary | ICD-10-CM | POA: Diagnosis present

## 2021-03-05 DIAGNOSIS — E611 Iron deficiency: Secondary | ICD-10-CM | POA: Insufficient documentation

## 2021-03-05 LAB — CBC WITH DIFFERENTIAL (CANCER CENTER ONLY)
Abs Immature Granulocytes: 0.03 10*3/uL (ref 0.00–0.07)
Basophils Absolute: 0.2 10*3/uL — ABNORMAL HIGH (ref 0.0–0.1)
Basophils Relative: 3 %
Eosinophils Absolute: 0.3 10*3/uL (ref 0.0–0.5)
Eosinophils Relative: 3 %
HCT: 44.3 % (ref 39.0–52.0)
Hemoglobin: 12.5 g/dL — ABNORMAL LOW (ref 13.0–17.0)
Immature Granulocytes: 0 %
Lymphocytes Relative: 13 %
Lymphs Abs: 1.2 10*3/uL (ref 0.7–4.0)
MCH: 18.9 pg — ABNORMAL LOW (ref 26.0–34.0)
MCHC: 28.2 g/dL — ABNORMAL LOW (ref 30.0–36.0)
MCV: 67.1 fL — ABNORMAL LOW (ref 80.0–100.0)
Monocytes Absolute: 0.5 10*3/uL (ref 0.1–1.0)
Monocytes Relative: 5 %
Neutro Abs: 6.7 10*3/uL (ref 1.7–7.7)
Neutrophils Relative %: 76 %
Platelet Count: 669 10*3/uL — ABNORMAL HIGH (ref 150–400)
RBC: 6.6 MIL/uL — ABNORMAL HIGH (ref 4.22–5.81)
RDW: 20.7 % — ABNORMAL HIGH (ref 11.5–15.5)
WBC Count: 8.9 10*3/uL (ref 4.0–10.5)
nRBC: 0 % (ref 0.0–0.2)

## 2021-03-07 ENCOUNTER — Telehealth: Payer: Self-pay | Admitting: *Deleted

## 2021-03-07 NOTE — Telephone Encounter (Addendum)
Patient reports he has not taken any Hydrea since before he saw Dr. Benay Spice due to side effects and does not want to start it again. Asking if it would be reasonable to pursue the [facility] since he could not tolerate the Hydrea.

## 2021-03-07 NOTE — Telephone Encounter (Signed)
-----   Message from Ladell Pier, MD sent at 03/05/2021  1:00 PM EDT ----- Please call patient, hemoglobin remains in goal range, platelets are still mildly elevated, increase the hydroxyurea to 500 mg daily if he is not having side effects from the hydroxyurea, follow-up as scheduled

## 2021-03-10 ENCOUNTER — Other Ambulatory Visit (HOSPITAL_COMMUNITY): Payer: Self-pay

## 2021-03-10 ENCOUNTER — Telehealth: Payer: Self-pay | Admitting: Pharmacy Technician

## 2021-03-10 NOTE — Telephone Encounter (Signed)
Oral Oncology Patient Advocate Encounter   Received notification from [facility] that prior authorization for Alejandro Ward is required.   PA submitted on CoverMyMeds Key [id number]  Status is pending   Oral Oncology Clinic will continue to follow.  [place] Patient [place] Phone [phone] Fax [phone] 03/10/2021 2:55 PM

## 2021-03-11 ENCOUNTER — Other Ambulatory Visit: Payer: Self-pay | Admitting: *Deleted

## 2021-03-11 ENCOUNTER — Other Ambulatory Visit (HOSPITAL_COMMUNITY): Payer: Self-pay

## 2021-03-11 NOTE — Telephone Encounter (Signed)
Oral Oncology Patient Advocate Encounter  Prior Authorization for Alejandro Ward has been approved.    PA# [id number] Effective dates: 03/10/21 until further notice  Patients co-pay is $3351.59  Oral Oncology Clinic will continue to follow.   [place] Patient [place] Phone [phone] Fax [phone] 03/11/2021 8:49 AM

## 2021-03-12 ENCOUNTER — Other Ambulatory Visit (HOSPITAL_COMMUNITY): Payer: Self-pay

## 2021-03-12 ENCOUNTER — Telehealth: Payer: Self-pay | Admitting: Pharmacy Technician

## 2021-03-12 NOTE — Telephone Encounter (Signed)
Oral Oncology Patient Advocate Encounter   Was successful in securing patient a $7,000 grant from [employer] to provide copayment coverage for Jakafi.  This will keep the out of pocket expense at $0.     I have spoken with the patient.    The billing information is as follows and has been shared with [place].   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  [place] Patient [place] Phone [phone] Fax [phone] 03/12/2021 2:15 PM

## 2021-03-12 NOTE — Telephone Encounter (Signed)
Oral Oncology Patient Advocate Encounter   Was successful in securing patient an $52 grant from Patient [facility]) to provide copayment coverage for Jakafi.  This will keep the out of pocket expense at $0.     I have spoken with the patient.    The billing information is as follows and has been shared with [place].   Member ID: [id number] Group ID: [id number] RxBin: [id number] Dates of Eligibility: 12/12/20 through 03/11/22  Fund:  [place] Negative Myeloproliferative Neoplasms  [place] Patient [place] Phone [phone] Fax [phone] 03/12/2021 2:12 PM

## 2021-03-14 ENCOUNTER — Other Ambulatory Visit: Payer: Self-pay | Admitting: *Deleted

## 2021-03-14 ENCOUNTER — Other Ambulatory Visit (HOSPITAL_COMMUNITY): Payer: Self-pay

## 2021-03-14 MED ORDER — RUXOLITINIB PHOSPHATE 10 MG PO TABS
10.0000 mg | ORAL_TABLET | Freq: Two times a day (BID) | ORAL | 0 refills | Status: DC
  Filled 2021-03-14 – 2021-03-17 (×2): qty 60, 30d supply, fill #0

## 2021-03-14 NOTE — Progress Notes (Signed)
Per Dr. Benay Spice: His Jakafi dose is 10 mg bid. Script sent to [facility]

## 2021-03-17 ENCOUNTER — Telehealth: Payer: Self-pay | Admitting: Pharmacist

## 2021-03-17 ENCOUNTER — Other Ambulatory Visit (HOSPITAL_COMMUNITY): Payer: Self-pay

## 2021-03-17 ENCOUNTER — Encounter: Payer: Self-pay | Admitting: Family

## 2021-03-17 NOTE — Telephone Encounter (Signed)
Oral Chemotherapy Pharmacist Encounter  [place] will be delivering [place] to Mr. Defusco on 03/18/21.   Patient Education I spoke with patient for overview of new oral chemotherapy medication: Jakafi (ruxolitinib) for the treatment of PV, planned duration until disease progression or unacceptable drug toxicity.   Pt is doing well. Counseled patient on administration, dosing, side effects, monitoring, drug-food interactions, safe handling, storage, and disposal. Patient will take 1 tablet (10 mg total) by mouth 2 (two) times daily.  Side effects include but not limited to: decreased hgb/plt and infection.    Reviewed with patient importance of keeping a medication schedule and plan for any missed doses.  After discussion with patient no patient barriers to medication adherence identified.   Mr. Hackley voiced understanding and appreciation. All questions answered. Medication handout provided.  Provided patient with [facility] phone number. Patient knows to call the office with questions or concerns. Oral Chemotherapy Navigation Clinic will continue to follow.  Darl Pikes, PharmD, BCPS, BCOP, CPP Hematology/Oncology Clinical Pharmacist Practitioner [facility] [phone]  03/17/2021 1:10 PM

## 2021-03-18 ENCOUNTER — Other Ambulatory Visit: Payer: Self-pay

## 2021-03-18 ENCOUNTER — Encounter: Payer: Self-pay | Admitting: Oncology

## 2021-03-18 ENCOUNTER — Inpatient Hospital Stay (HOSPITAL_BASED_OUTPATIENT_CLINIC_OR_DEPARTMENT_OTHER): Payer: Medicare Other | Admitting: Oncology

## 2021-03-18 ENCOUNTER — Inpatient Hospital Stay: Payer: Medicare Other

## 2021-03-18 VITALS — BP 121/70 | HR 62 | Temp 98.1°F | Resp 20 | Ht 71.0 in | Wt 185.6 lb

## 2021-03-18 DIAGNOSIS — Z5181 Encounter for therapeutic drug level monitoring: Secondary | ICD-10-CM

## 2021-03-18 DIAGNOSIS — D45 Polycythemia vera: Secondary | ICD-10-CM | POA: Diagnosis not present

## 2021-03-18 LAB — CBC WITH DIFFERENTIAL (CANCER CENTER ONLY)
Abs Immature Granulocytes: 0.04 10*3/uL (ref 0.00–0.07)
Basophils Absolute: 0.2 10*3/uL — ABNORMAL HIGH (ref 0.0–0.1)
Basophils Relative: 3 %
Eosinophils Absolute: 0.3 10*3/uL (ref 0.0–0.5)
Eosinophils Relative: 3 %
HCT: 44.6 % (ref 39.0–52.0)
Hemoglobin: 12.5 g/dL — ABNORMAL LOW (ref 13.0–17.0)
Immature Granulocytes: 1 %
Lymphocytes Relative: 14 %
Lymphs Abs: 1.1 10*3/uL (ref 0.7–4.0)
MCH: 19 pg — ABNORMAL LOW (ref 26.0–34.0)
MCHC: 28 g/dL — ABNORMAL LOW (ref 30.0–36.0)
MCV: 67.8 fL — ABNORMAL LOW (ref 80.0–100.0)
Monocytes Absolute: 0.5 10*3/uL (ref 0.1–1.0)
Monocytes Relative: 6 %
Neutro Abs: 6.2 10*3/uL (ref 1.7–7.7)
Neutrophils Relative %: 73 %
Platelet Count: 523 10*3/uL — ABNORMAL HIGH (ref 150–400)
RBC: 6.58 MIL/uL — ABNORMAL HIGH (ref 4.22–5.81)
RDW: 20.6 % — ABNORMAL HIGH (ref 11.5–15.5)
WBC Count: 8.3 10*3/uL (ref 4.0–10.5)
nRBC: 0 % (ref 0.0–0.2)

## 2021-03-18 NOTE — Progress Notes (Signed)
  [place] OFFICE PROGRESS NOTE   Diagnosis: Polycythemia vera  INTERVAL HISTORY:   [name] returns as scheduled.  He feels well.  He decided against resuming hydroxyurea.  His insurance approved Jakafi.  He would like to begin a trial of Jakafi.  Objective:  Vital signs in last 24 hours:  Blood pressure 121/70, pulse 62, temperature 98.1 F (36.7 C), temperature source Oral, resp. rate 20, height '5\' 11"'$  (1.803 m), weight 185 lb 9.6 oz (84.2 kg), SpO2 100 %.    Resp: Distant breath sounds, lungs clear bilaterally Cardio: Regular rate and rhythm GI: No hepatosplenomegaly Vascular: No leg edema   Lab Results:  Lab Results  Component Value Date   WBC 8.3 03/18/2021   HGB 12.5 (L) 03/18/2021   HCT 44.6 03/18/2021   MCV 67.8 (L) 03/18/2021   PLT 523 (H) 03/18/2021   NEUTROABS PENDING 03/18/2021    CMP  Lab Results  Component Value Date   NA 139 02/11/2021   K 4.0 02/11/2021   CL 106 02/11/2021   CO2 27 02/11/2021   GLUCOSE 80 02/11/2021   BUN 16 02/11/2021   CREATININE 1.10 02/11/2021   CALCIUM 9.0 02/11/2021   PROT 6.0 (L) 02/11/2021   ALBUMIN 4.1 02/11/2021   AST 21 02/11/2021   ALT 18 02/11/2021   ALKPHOS 69 02/11/2021   BILITOT 0.6 02/11/2021   GFRNONAA >60 02/11/2021   GFRAA >60 04/16/2020     Medications: I have reviewed the patient's current medications.   Assessment/Plan: Polycythemia vera, JAK2 positive Maintained on intermittent phlebotomy therapy since diagnosis in late 2019 Trial of hydroxyurea 02/08/2021, discontinued after approximately 5 days secondary to nausea, diarrhea, and pruritus Hydroxyurea 500 mg every other day starting 02/18/2021-he did not restart hydroxyurea Jakafi 10 mg twice daily 03/21/2021 Thrombocytosis secondary to #1 and iron deficiency Iron deficiency secondary to polycythemia and phlebotomy COVID-19 infection May 2022 Right leg cellulitis 2020 Splenomegaly secondary to #1    Disposition: Mr.  Alejandro Ward appears stable.  The hemoglobin/hematocrit remain in the goal range.  The platelets are mildly elevated.  We discussed the risk/benefit of beginning Jakafi therapy.  The goal of therapy is to decrease his requirement for phlebotomy and lower the platelet count to less than 500,000.  He is asymptomatic from the polycythemia vera.  We reviewed potential toxicities associated with Jakafi including the chance of hematologic toxicity, hepatotoxicity, infection, and alteration of the lipid profile.  He also discussed side effects with the cancer center pharmacist.  He will begin [place] on 03/21/2021.  He will return for a lab visit on 04/08/2021 and an office visit during the week of 04/21/2021.  Betsy Coder, MD  03/18/2021  8:42 AM

## 2021-03-19 NOTE — Addendum Note (Signed)
Addended by: Lenox Ponds E on: 03/19/2021 05:16 PM   Modules accepted: Orders

## 2021-04-08 ENCOUNTER — Other Ambulatory Visit (HOSPITAL_COMMUNITY): Payer: Self-pay

## 2021-04-09 ENCOUNTER — Inpatient Hospital Stay: Payer: Medicare Other | Attending: Hematology & Oncology

## 2021-04-09 ENCOUNTER — Encounter: Payer: Self-pay | Admitting: Oncology

## 2021-04-09 ENCOUNTER — Encounter: Payer: Self-pay | Admitting: Internal Medicine

## 2021-04-09 ENCOUNTER — Encounter: Payer: Self-pay | Admitting: *Deleted

## 2021-04-09 ENCOUNTER — Other Ambulatory Visit: Payer: Self-pay

## 2021-04-09 DIAGNOSIS — Z79899 Other long term (current) drug therapy: Secondary | ICD-10-CM | POA: Insufficient documentation

## 2021-04-09 DIAGNOSIS — D75838 Other thrombocytosis: Secondary | ICD-10-CM | POA: Insufficient documentation

## 2021-04-09 DIAGNOSIS — D508 Other iron deficiency anemias: Secondary | ICD-10-CM | POA: Insufficient documentation

## 2021-04-09 DIAGNOSIS — D45 Polycythemia vera: Secondary | ICD-10-CM | POA: Diagnosis present

## 2021-04-09 DIAGNOSIS — Z8616 Personal history of COVID-19: Secondary | ICD-10-CM | POA: Insufficient documentation

## 2021-04-09 DIAGNOSIS — D751 Secondary polycythemia: Secondary | ICD-10-CM | POA: Insufficient documentation

## 2021-04-09 DIAGNOSIS — R161 Splenomegaly, not elsewhere classified: Secondary | ICD-10-CM | POA: Diagnosis not present

## 2021-04-09 LAB — CBC WITH DIFFERENTIAL (CANCER CENTER ONLY)
Abs Immature Granulocytes: 0.02 10*3/uL (ref 0.00–0.07)
Basophils Absolute: 0.2 10*3/uL — ABNORMAL HIGH (ref 0.0–0.1)
Basophils Relative: 3 %
Eosinophils Absolute: 0.3 10*3/uL (ref 0.0–0.5)
Eosinophils Relative: 3 %
HCT: 45.3 % (ref 39.0–52.0)
Hemoglobin: 13.1 g/dL (ref 13.0–17.0)
Immature Granulocytes: 0 %
Lymphocytes Relative: 13 %
Lymphs Abs: 1.1 10*3/uL (ref 0.7–4.0)
MCH: 19.4 pg — ABNORMAL LOW (ref 26.0–34.0)
MCHC: 28.9 g/dL — ABNORMAL LOW (ref 30.0–36.0)
MCV: 67 fL — ABNORMAL LOW (ref 80.0–100.0)
Monocytes Absolute: 0.5 10*3/uL (ref 0.1–1.0)
Monocytes Relative: 6 %
Neutro Abs: 6.5 10*3/uL (ref 1.7–7.7)
Neutrophils Relative %: 75 %
Platelet Count: 651 10*3/uL — ABNORMAL HIGH (ref 150–400)
RBC: 6.76 MIL/uL — ABNORMAL HIGH (ref 4.22–5.81)
RDW: 20.1 % — ABNORMAL HIGH (ref 11.5–15.5)
WBC Count: 8.7 10*3/uL (ref 4.0–10.5)
nRBC: 0 % (ref 0.0–0.2)

## 2021-04-09 LAB — CMP (CANCER CENTER ONLY)
ALT: 20 U/L (ref 0–44)
AST: 27 U/L (ref 15–41)
Albumin: 4.1 g/dL (ref 3.5–5.0)
Alkaline Phosphatase: 80 U/L (ref 38–126)
Anion gap: 8 (ref 5–15)
BUN: 21 mg/dL (ref 8–23)
CO2: 24 mmol/L (ref 22–32)
Calcium: 8.9 mg/dL (ref 8.9–10.3)
Chloride: 106 mmol/L (ref 98–111)
Creatinine: 1.15 mg/dL (ref 0.61–1.24)
GFR, Estimated: >60 mL/min (ref >60)
Glucose, Bld: 87 mg/dL (ref 70–99)
Potassium: 4.9 mmol/L (ref 3.5–5.1)
Sodium: 138 mmol/L (ref 135–145)
Total Bilirubin: 0.7 mg/dL (ref 0.3–1.2)
Total Protein: 6.4 g/dL — ABNORMAL LOW (ref 6.5–8.1)

## 2021-04-10 ENCOUNTER — Other Ambulatory Visit (HOSPITAL_COMMUNITY): Payer: Self-pay

## 2021-04-21 ENCOUNTER — Other Ambulatory Visit: Payer: Self-pay

## 2021-04-22 ENCOUNTER — Encounter: Payer: Self-pay | Admitting: Internal Medicine

## 2021-04-22 ENCOUNTER — Ambulatory Visit (INDEPENDENT_AMBULATORY_CARE_PROVIDER_SITE_OTHER): Payer: Medicare Other | Admitting: Internal Medicine

## 2021-04-22 VITALS — BP 120/80 | HR 66 | Temp 98.2°F | Wt 184.7 lb

## 2021-04-22 DIAGNOSIS — D45 Polycythemia vera: Secondary | ICD-10-CM | POA: Diagnosis not present

## 2021-04-22 DIAGNOSIS — Z23 Encounter for immunization: Secondary | ICD-10-CM

## 2021-04-22 NOTE — Addendum Note (Signed)
Addended by: Westley Hummer B on: 04/22/2021 05:00 PM   Modules accepted: Orders

## 2021-04-22 NOTE — Progress Notes (Signed)
Established Patient Office Visit     This visit occurred during the SARS-CoV-2 public health emergency.  Safety protocols were in place, including screening questions prior to the visit, additional usage of staff PPE, and extensive cleaning of exam room while observing appropriate contact time as indicated for disinfecting solutions.    CC/Reason for Visit: 42-month follow-up chronic medical conditions  HPI: Alejandro Ward is a 70 y.o. male who is coming in today for the above mentioned reasons. Past Medical History is significant for: Polycythemia vera, now followed by Dr. Learta Codding on aspirin 81 mg daily and Jakafi.  He is scheduled to see him tomorrow for a lab visit to follow on his platelet count and RBCs.  He has been doing well and has no acute concerns.  He never had his colonoscopy after referral was placed 6 months ago, he would like to proceed with that now.  He is due for COVID booster, flu vaccine, conjugated pneumonia vaccine, Tdap and shingles.  He did have COVID in early June and has recovered well.   Past Medical/Surgical History: No past medical history on file.  Past Surgical History:  Procedure Laterality Date   NOSE SURGERY     broke as a child in football    Social History:  reports that he has never smoked. He has never used smokeless tobacco. He reports current alcohol use. No history on file for drug use.  Allergies: No Known Allergies  Family History:  Family History  Problem Relation Age of Onset   Diabetes Mother    Hypertension Mother    Heart attack Father    Hyperlipidemia Neg Hx    Sudden death Neg Hx      Current Outpatient Medications:    aspirin 81 MG EC tablet, Take by mouth., Disp: , Rfl:    ruxolitinib phosphate (JAKAFI) 10 MG tablet, Take 1 tablet (10 mg total) by mouth 2 (two) times daily., Disp: 60 tablet, Rfl: 0  Review of Systems:  Constitutional: Denies fever, chills, diaphoresis, appetite change and fatigue.  HEENT:  Denies photophobia, eye pain, redness, hearing loss, ear pain, congestion, sore throat, rhinorrhea, sneezing, mouth sores, trouble swallowing, neck pain, neck stiffness and tinnitus.   Respiratory: Denies SOB, DOE, cough, chest tightness,  and wheezing.   Cardiovascular: Denies chest pain, palpitations and leg swelling.  Gastrointestinal: Denies nausea, vomiting, abdominal pain, diarrhea, constipation, blood in stool and abdominal distention.  Genitourinary: Denies dysuria, urgency, frequency, hematuria, flank pain and difficulty urinating.  Endocrine: Denies: hot or cold intolerance, sweats, changes in hair or nails, polyuria, polydipsia. Musculoskeletal: Denies myalgias, back pain, joint swelling, arthralgias and gait problem.  Skin: Denies pallor, rash and wound.  Neurological: Denies dizziness, seizures, syncope, weakness, light-headedness, numbness and headaches.  Hematological: Denies adenopathy. Easy bruising, personal or family bleeding history  Psychiatric/Behavioral: Denies suicidal ideation, mood changes, confusion, nervousness, sleep disturbance and agitation    Physical Exam: Vitals:   04/22/21 0700  BP: 120/80  Pulse: 66  Temp: 98.2 F (36.8 C)  TempSrc: Oral  SpO2: 97%  Weight: 184 lb 11.2 oz (83.8 kg)    Body mass index is 25.76 kg/m.   Constitutional: NAD, calm, comfortable Eyes: PERRL, lids and conjunctivae normal, wears corrective lenses ENMT: Mucous membranes are moist.  Respiratory: clear to auscultation bilaterally, no wheezing, no crackles. Normal respiratory effort. No accessory muscle use.  Cardiovascular: Regular rate and rhythm, no murmurs / rubs / gallops. No extremity edema.  Neurologic: Grossly intact and  nonfocal Psychiatric: Normal judgment and insight. Alert and oriented x 3. Normal mood.    Impression and Plan:  Polycythemia vera ([place]) -Followed by hematology, on aspirin and Jakafi.  Need for influenza vaccination -Flu vaccine  administered today.  Need for vaccination against Streptococcus pneumoniae -PCV 13 administered today, he received PPSV23 last year.  Time spent: 32 minutes reviewing chart, interviewing and examining patient and formulating plan of care.   Patient Instructions  -Nice seeing you today!!  -Lab work today; will notify you once results are available.  -Flu and final pneumonia vaccine today.  -Remember your COVID booster, tetanus and shingles vaccines at your pharmacy.  -Schedule follow up in 6 months for your physical.  -Remember to schedule your colonoscopy.    Lelon Frohlich, MD [facility]

## 2021-04-22 NOTE — Patient Instructions (Signed)
-  Nice seeing you today!!  -Lab work today; will notify you once results are available.  -Flu and final pneumonia vaccine today.  -Remember your COVID booster, tetanus and shingles vaccines at your pharmacy.  -Schedule follow up in 6 months for your physical.  -Remember to schedule your colonoscopy.

## 2021-04-23 ENCOUNTER — Telehealth: Payer: Self-pay

## 2021-04-23 ENCOUNTER — Other Ambulatory Visit: Payer: Self-pay

## 2021-04-23 ENCOUNTER — Inpatient Hospital Stay: Payer: Medicare Other

## 2021-04-23 ENCOUNTER — Encounter: Payer: Self-pay | Admitting: Oncology

## 2021-04-23 ENCOUNTER — Inpatient Hospital Stay (HOSPITAL_BASED_OUTPATIENT_CLINIC_OR_DEPARTMENT_OTHER): Payer: Medicare Other | Admitting: Oncology

## 2021-04-23 VITALS — BP 134/75 | HR 60 | Temp 98.1°F | Resp 20 | Ht 71.0 in | Wt 184.6 lb

## 2021-04-23 DIAGNOSIS — D45 Polycythemia vera: Secondary | ICD-10-CM

## 2021-04-23 LAB — CBC WITH DIFFERENTIAL (CANCER CENTER ONLY)
Abs Immature Granulocytes: 0.05 10*3/uL (ref 0.00–0.07)
Basophils Absolute: 0.2 10*3/uL — ABNORMAL HIGH (ref 0.0–0.1)
Basophils Relative: 3 %
Eosinophils Absolute: 0.3 10*3/uL (ref 0.0–0.5)
Eosinophils Relative: 3 %
HCT: 46.8 % (ref 39.0–52.0)
Hemoglobin: 13.5 g/dL (ref 13.0–17.0)
Immature Granulocytes: 1 %
Lymphocytes Relative: 10 %
Lymphs Abs: 0.9 10*3/uL (ref 0.7–4.0)
MCH: 19.5 pg — ABNORMAL LOW (ref 26.0–34.0)
MCHC: 28.8 g/dL — ABNORMAL LOW (ref 30.0–36.0)
MCV: 67.4 fL — ABNORMAL LOW (ref 80.0–100.0)
Monocytes Absolute: 0.5 10*3/uL (ref 0.1–1.0)
Monocytes Relative: 5 %
Neutro Abs: 7.3 10*3/uL (ref 1.7–7.7)
Neutrophils Relative %: 78 %
Platelet Count: 589 10*3/uL — ABNORMAL HIGH (ref 150–400)
RBC: 6.94 MIL/uL — ABNORMAL HIGH (ref 4.22–5.81)
RDW: 21.2 % — ABNORMAL HIGH (ref 11.5–15.5)
WBC Count: 9.3 10*3/uL (ref 4.0–10.5)
nRBC: 0 % (ref 0.0–0.2)

## 2021-04-23 LAB — CMP (CANCER CENTER ONLY)
ALT: 17 U/L (ref 0–44)
AST: 27 U/L (ref 15–41)
Albumin: 4.3 g/dL (ref 3.5–5.0)
Alkaline Phosphatase: 73 U/L (ref 38–126)
Anion gap: 7 (ref 5–15)
BUN: 23 mg/dL (ref 8–23)
CO2: 25 mmol/L (ref 22–32)
Calcium: 9.1 mg/dL (ref 8.9–10.3)
Chloride: 106 mmol/L (ref 98–111)
Creatinine: 1.28 mg/dL — ABNORMAL HIGH (ref 0.61–1.24)
GFR, Estimated: 60 mL/min — ABNORMAL LOW (ref >60)
Glucose, Bld: 81 mg/dL (ref 70–99)
Potassium: 4.4 mmol/L (ref 3.5–5.1)
Sodium: 138 mmol/L (ref 135–145)
Total Bilirubin: 0.5 mg/dL (ref 0.3–1.2)
Total Protein: 6.4 g/dL — ABNORMAL LOW (ref 6.5–8.1)

## 2021-04-23 NOTE — Telephone Encounter (Signed)
Pt sent My chart message inquiring about a phlebotomy to be scheduled message given to Dr. Benay Spice who stated Pt's HGB is less than 15 and his hematocrit is borderline. He wants to give it some time and let the [facility] work. This message sent back to Pt on my chart.

## 2021-04-23 NOTE — Progress Notes (Signed)
  [facility] OFFICE PROGRESS NOTE   Diagnosis: Polycythemia vera  INTERVAL HISTORY:   Alejandro Ward returns as scheduled.  He begin Jakafi on 04/10/2021.  No bleeding or symptom of thrombosis.  No rash.  He reports loose stools since starting Jakafi.  Objective:  Vital signs in last 24 hours:  Blood pressure 134/75, pulse 60, temperature 98.1 F (36.7 C), temperature source Oral, resp. rate 20, height 5\' 11"  (1.803 m), weight 184 lb 9.6 oz (83.7 kg), SpO2 100 %.    Resp: Lungs clear bilaterally Cardio: Regular rate and rhythm GI: No hepatosplenomegaly Vascular: No leg edema  Skin: No rash   Lab Results:  Lab Results  Component Value Date   WBC 9.3 04/23/2021   HGB 13.5 04/23/2021   HCT 46.8 04/23/2021   MCV 67.4 (L) 04/23/2021   PLT 589 (H) 04/23/2021   NEUTROABS 7.3 04/23/2021    CMP  Lab Results  Component Value Date   NA 138 04/23/2021   K 4.4 04/23/2021   CL 106 04/23/2021   CO2 25 04/23/2021   GLUCOSE 81 04/23/2021   BUN 23 04/23/2021   CREATININE 1.28 (H) 04/23/2021   CALCIUM 9.1 04/23/2021   PROT 6.4 (L) 04/23/2021   ALBUMIN 4.3 04/23/2021   AST 27 04/23/2021   ALT 17 04/23/2021   ALKPHOS 73 04/23/2021   BILITOT 0.5 04/23/2021   GFRNONAA 60 (L) 04/23/2021   GFRAA >60 04/16/2020     Medications: I have reviewed the patient's current medications.   Assessment/Plan: Polycythemia vera, JAK2 positive Maintained on intermittent phlebotomy therapy since diagnosis in late 2019 Trial of hydroxyurea 02/08/2021, discontinued after approximately 5 days secondary to nausea, diarrhea, and pruritus Hydroxyurea 500 mg every other day starting 02/18/2021-he did not restart hydroxyurea Jakafi 10 mg twice daily 04/10/2021 Thrombocytosis secondary to #1 and iron deficiency Iron deficiency secondary to polycythemia and phlebotomy COVID-19 infection May 2022 Right leg cellulitis 2020 Splenomegaly secondary to #1      Disposition: Alejandro Ward appears  well.  The platelet count is slightly lower since beginning [place].  He will continue [employer] at the current dose.  He will return for lab visit in 3 weeks and an office visit in 6 weeks.  We will schedule phlebotomy therapy if the hemoglobin/hematocrit increase.  Betsy Coder, MD  04/23/2021  9:25 AM

## 2021-04-24 ENCOUNTER — Encounter: Payer: Self-pay | Admitting: Oncology

## 2021-04-27 ENCOUNTER — Encounter: Payer: Self-pay | Admitting: Oncology

## 2021-04-28 ENCOUNTER — Other Ambulatory Visit: Payer: Self-pay

## 2021-04-28 DIAGNOSIS — D45 Polycythemia vera: Secondary | ICD-10-CM

## 2021-04-28 NOTE — Progress Notes (Signed)
Cbc with diff entered

## 2021-04-29 ENCOUNTER — Other Ambulatory Visit (HOSPITAL_COMMUNITY): Payer: Self-pay

## 2021-04-29 ENCOUNTER — Telehealth: Payer: Self-pay | Admitting: *Deleted

## 2021-04-29 ENCOUNTER — Other Ambulatory Visit: Payer: Self-pay | Admitting: Oncology

## 2021-04-29 ENCOUNTER — Other Ambulatory Visit: Payer: Self-pay

## 2021-04-29 ENCOUNTER — Inpatient Hospital Stay: Payer: Medicare Other

## 2021-04-29 DIAGNOSIS — D45 Polycythemia vera: Secondary | ICD-10-CM | POA: Diagnosis not present

## 2021-04-29 LAB — CBC WITH DIFFERENTIAL (CANCER CENTER ONLY)
Abs Immature Granulocytes: 0.08 10*3/uL — ABNORMAL HIGH (ref 0.00–0.07)
Basophils Absolute: 0.2 10*3/uL — ABNORMAL HIGH (ref 0.0–0.1)
Basophils Relative: 2 %
Eosinophils Absolute: 0.2 10*3/uL (ref 0.0–0.5)
Eosinophils Relative: 3 %
HCT: 47.8 % (ref 39.0–52.0)
Hemoglobin: 13.8 g/dL (ref 13.0–17.0)
Immature Granulocytes: 1 %
Lymphocytes Relative: 11 %
Lymphs Abs: 0.9 10*3/uL (ref 0.7–4.0)
MCH: 19.5 pg — ABNORMAL LOW (ref 26.0–34.0)
MCHC: 28.9 g/dL — ABNORMAL LOW (ref 30.0–36.0)
MCV: 67.5 fL — ABNORMAL LOW (ref 80.0–100.0)
Monocytes Absolute: 0.6 10*3/uL (ref 0.1–1.0)
Monocytes Relative: 7 %
Neutro Abs: 6.7 10*3/uL (ref 1.7–7.7)
Neutrophils Relative %: 76 %
Platelet Count: 584 10*3/uL — ABNORMAL HIGH (ref 150–400)
RBC: 7.08 MIL/uL — ABNORMAL HIGH (ref 4.22–5.81)
RDW: 22.7 % — ABNORMAL HIGH (ref 11.5–15.5)
WBC Count: 8.7 10*3/uL (ref 4.0–10.5)
nRBC: 0 % (ref 0.0–0.2)

## 2021-04-29 MED ORDER — RUXOLITINIB PHOSPHATE 10 MG PO TABS
10.0000 mg | ORAL_TABLET | Freq: Two times a day (BID) | ORAL | 0 refills | Status: DC
  Filled 2021-04-29: qty 60, 30d supply, fill #0

## 2021-04-29 NOTE — Telephone Encounter (Signed)
Dr. Benay Spice reviewed CBC today. Nothing on CBC reflects cause of his upper extremity bleeding or side effect of Jakafi. Needs to resume Jakafi and take Imodium for the loose stools.  Patient informed and expresses concern that his HCT is going up. Does not want to wait any longer. Asking for a phlebotomy this week. MD notified.

## 2021-04-30 ENCOUNTER — Encounter: Payer: Self-pay | Admitting: Oncology

## 2021-05-01 ENCOUNTER — Other Ambulatory Visit: Payer: Self-pay

## 2021-05-01 ENCOUNTER — Inpatient Hospital Stay: Payer: Medicare Other

## 2021-05-01 VITALS — BP 162/77 | HR 57 | Temp 98.2°F | Resp 18

## 2021-05-01 DIAGNOSIS — D45 Polycythemia vera: Secondary | ICD-10-CM

## 2021-05-01 NOTE — Progress Notes (Signed)
Alejandro Ward presents today for phlebotomy per MD orders. Phlebotomy procedure started at 1358 and ended at 1406 using at 58 G phlebotomy kit in the L [facility] with 510 grams removed. IV needle removed intake and patient tolerated procedure well. Patient provided with snack and beverage after completion of phlebotomy.  Patient observed for 15 minutes after procedure (per patient request) without any incident. VSS upon leaving infusion room.

## 2021-05-01 NOTE — Patient Instructions (Signed)

## 2021-05-05 ENCOUNTER — Other Ambulatory Visit (HOSPITAL_COMMUNITY): Payer: Self-pay

## 2021-05-09 ENCOUNTER — Encounter: Payer: Self-pay | Admitting: Oncology

## 2021-05-12 ENCOUNTER — Encounter: Payer: Self-pay | Admitting: Oncology

## 2021-05-14 ENCOUNTER — Inpatient Hospital Stay: Payer: Medicare Other | Attending: Hematology & Oncology

## 2021-05-14 ENCOUNTER — Inpatient Hospital Stay: Payer: Medicare Other

## 2021-05-14 ENCOUNTER — Other Ambulatory Visit: Payer: Self-pay

## 2021-05-14 DIAGNOSIS — D45 Polycythemia vera: Secondary | ICD-10-CM | POA: Insufficient documentation

## 2021-05-14 LAB — CBC WITH DIFFERENTIAL (CANCER CENTER ONLY)
Abs Immature Granulocytes: 0.04 10*3/uL (ref 0.00–0.07)
Basophils Absolute: 0.1 10*3/uL (ref 0.0–0.1)
Basophils Relative: 2 %
Eosinophils Absolute: 0.3 10*3/uL (ref 0.0–0.5)
Eosinophils Relative: 5 %
HCT: 42.8 % (ref 39.0–52.0)
Hemoglobin: 12.4 g/dL — ABNORMAL LOW (ref 13.0–17.0)
Immature Granulocytes: 1 %
Lymphocytes Relative: 16 %
Lymphs Abs: 1 10*3/uL (ref 0.7–4.0)
MCH: 20.1 pg — ABNORMAL LOW (ref 26.0–34.0)
MCHC: 29 g/dL — ABNORMAL LOW (ref 30.0–36.0)
MCV: 69.3 fL — ABNORMAL LOW (ref 80.0–100.0)
Monocytes Absolute: 0.4 10*3/uL (ref 0.1–1.0)
Monocytes Relative: 6 %
Neutro Abs: 4.6 10*3/uL (ref 1.7–7.7)
Neutrophils Relative %: 70 %
Platelet Count: 385 10*3/uL (ref 150–400)
RBC: 6.18 MIL/uL — ABNORMAL HIGH (ref 4.22–5.81)
RDW: 24.2 % — ABNORMAL HIGH (ref 11.5–15.5)
WBC Count: 6.4 10*3/uL (ref 4.0–10.5)
nRBC: 0 % (ref 0.0–0.2)

## 2021-05-14 NOTE — Progress Notes (Signed)
Pt HCT 42.8 with today's labs. Treatment plan parameters keep HCT <45%. Dr Benay Spice notified. Pt updated, stated to nurse that he was under his 45% and was leaving.

## 2021-05-15 ENCOUNTER — Telehealth: Payer: Self-pay

## 2021-05-15 NOTE — Telephone Encounter (Signed)
-----   Message from Ladell Pier, MD sent at 05/14/2021  1:59 PM EDT ----- Please call patient, platelet count and hemoglobin are improved, continue Jakafi, follow-up as scheduled

## 2021-05-15 NOTE — Telephone Encounter (Signed)
Called made pt aware of most recent lab results provider instruction to continue Jakafi at same order and f/u as scheduled no questions or concerns voiced call ended

## 2021-05-26 ENCOUNTER — Other Ambulatory Visit (HOSPITAL_COMMUNITY): Payer: Self-pay

## 2021-05-28 ENCOUNTER — Other Ambulatory Visit (HOSPITAL_COMMUNITY): Payer: Self-pay

## 2021-05-28 ENCOUNTER — Other Ambulatory Visit: Payer: Self-pay | Admitting: Oncology

## 2021-06-02 ENCOUNTER — Other Ambulatory Visit (HOSPITAL_COMMUNITY): Payer: Self-pay

## 2021-06-02 MED ORDER — RUXOLITINIB PHOSPHATE 10 MG PO TABS
10.0000 mg | ORAL_TABLET | Freq: Two times a day (BID) | ORAL | 0 refills | Status: DC
  Filled 2021-06-02: qty 60, 30d supply, fill #0

## 2021-06-04 ENCOUNTER — Other Ambulatory Visit: Payer: Self-pay

## 2021-06-04 ENCOUNTER — Inpatient Hospital Stay (HOSPITAL_BASED_OUTPATIENT_CLINIC_OR_DEPARTMENT_OTHER): Payer: Medicare Other | Admitting: Oncology

## 2021-06-04 ENCOUNTER — Inpatient Hospital Stay: Payer: Medicare Other | Attending: Hematology & Oncology

## 2021-06-04 VITALS — BP 140/59 | HR 61 | Temp 98.7°F | Resp 18 | Ht 71.0 in | Wt 187.0 lb

## 2021-06-04 DIAGNOSIS — Z13220 Encounter for screening for lipoid disorders: Secondary | ICD-10-CM

## 2021-06-04 DIAGNOSIS — D45 Polycythemia vera: Secondary | ICD-10-CM

## 2021-06-04 DIAGNOSIS — Z5181 Encounter for therapeutic drug level monitoring: Secondary | ICD-10-CM | POA: Diagnosis not present

## 2021-06-04 DIAGNOSIS — E611 Iron deficiency: Secondary | ICD-10-CM | POA: Insufficient documentation

## 2021-06-04 DIAGNOSIS — D75838 Other thrombocytosis: Secondary | ICD-10-CM | POA: Diagnosis not present

## 2021-06-04 LAB — CMP (CANCER CENTER ONLY)
ALT: 14 U/L (ref 0–44)
AST: 25 U/L (ref 15–41)
Albumin: 3.9 g/dL (ref 3.5–5.0)
Alkaline Phosphatase: 66 U/L (ref 38–126)
Anion gap: 7 (ref 5–15)
BUN: 18 mg/dL (ref 8–23)
CO2: 26 mmol/L (ref 22–32)
Calcium: 8.7 mg/dL — ABNORMAL LOW (ref 8.9–10.3)
Chloride: 106 mmol/L (ref 98–111)
Creatinine: 1.33 mg/dL — ABNORMAL HIGH (ref 0.61–1.24)
GFR, Estimated: 57 mL/min — ABNORMAL LOW (ref >60)
Glucose, Bld: 89 mg/dL (ref 70–99)
Potassium: 4.3 mmol/L (ref 3.5–5.1)
Sodium: 139 mmol/L (ref 135–145)
Total Bilirubin: 0.6 mg/dL (ref 0.3–1.2)
Total Protein: 6.3 g/dL — ABNORMAL LOW (ref 6.5–8.1)

## 2021-06-04 LAB — CBC WITH DIFFERENTIAL (CANCER CENTER ONLY)
Abs Immature Granulocytes: 0.03 10*3/uL (ref 0.00–0.07)
Basophils Absolute: 0.1 10*3/uL (ref 0.0–0.1)
Basophils Relative: 2 %
Eosinophils Absolute: 0.2 10*3/uL (ref 0.0–0.5)
Eosinophils Relative: 3 %
HCT: 41.4 % (ref 39.0–52.0)
Hemoglobin: 12.3 g/dL — ABNORMAL LOW (ref 13.0–17.0)
Immature Granulocytes: 0 %
Lymphocytes Relative: 16 %
Lymphs Abs: 1.1 10*3/uL (ref 0.7–4.0)
MCH: 21.6 pg — ABNORMAL LOW (ref 26.0–34.0)
MCHC: 29.7 g/dL — ABNORMAL LOW (ref 30.0–36.0)
MCV: 72.8 fL — ABNORMAL LOW (ref 80.0–100.0)
Monocytes Absolute: 0.4 10*3/uL (ref 0.1–1.0)
Monocytes Relative: 6 %
Neutro Abs: 5.1 10*3/uL (ref 1.7–7.7)
Neutrophils Relative %: 73 %
Platelet Count: 446 10*3/uL — ABNORMAL HIGH (ref 150–400)
RBC: 5.69 MIL/uL (ref 4.22–5.81)
RDW: 28 % — ABNORMAL HIGH (ref 11.5–15.5)
WBC Count: 6.9 10*3/uL (ref 4.0–10.5)
nRBC: 0 % (ref 0.0–0.2)

## 2021-06-04 NOTE — Progress Notes (Signed)
  [place] OFFICE PROGRESS NOTE   Diagnosis: Polycythemia vera  INTERVAL HISTORY:   Alejandro Ward returns as scheduled.  He continues oral ruxolitinib.  No bleeding or symptom of thrombosis.  He feels well.  He underwent phlebotomy therapy on 05/01/2021.  No new complaint.  Objective:  Vital signs in last 24 hours:  Blood pressure (!) 140/59, pulse 61, temperature 98.7 F (37.1 C), temperature source Oral, resp. rate 18, height 5\' 11"  (1.803 m), weight 187 lb (84.8 kg), SpO2 100 %.    HEENT: No thrush or ulcers Resp: Lungs clear bilaterally Cardio: Regular rate and rhythm GI: No hepatosplenomegaly Vascular: No leg edema  Lab Results:  Lab Results  Component Value Date   WBC 6.9 06/04/2021   HGB 12.3 (L) 06/04/2021   HCT 41.4 06/04/2021   MCV 72.8 (L) 06/04/2021   PLT 446 (H) 06/04/2021   NEUTROABS 5.1 06/04/2021    CMP  Lab Results  Component Value Date   NA 139 06/04/2021   K 4.3 06/04/2021   CL 106 06/04/2021   CO2 26 06/04/2021   GLUCOSE 89 06/04/2021   BUN 18 06/04/2021   CREATININE 1.33 (H) 06/04/2021   CALCIUM 8.7 (L) 06/04/2021   PROT 6.3 (L) 06/04/2021   ALBUMIN 3.9 06/04/2021   AST 25 06/04/2021   ALT 14 06/04/2021   ALKPHOS 66 06/04/2021   BILITOT 0.6 06/04/2021   GFRNONAA 57 (L) 06/04/2021   GFRAA >60 04/16/2020    No results found for: CEA1, CEA, CAN199, CA125  Lab Results  Component Value Date   INR 1.1 04/04/2020   LABPROT 13.8 04/04/2020    Imaging:  No results found.  Medications: I have reviewed the patient's current medications.   Assessment/Plan: Polycythemia vera, JAK2 positive Maintained on intermittent phlebotomy therapy since diagnosis in late 2019 Trial of hydroxyurea 02/08/2021, discontinued after approximately 5 days secondary to nausea, diarrhea, and pruritus Hydroxyurea 500 mg every other day starting 02/18/2021-he did not restart hydroxyurea Jakafi 10 mg twice daily 04/10/2021 Phlebotomy  05/01/2021 Thrombocytosis secondary to #1 and iron deficiency Iron deficiency secondary to polycythemia and phlebotomy COVID-19 infection May 2022 Right leg cellulitis 2020 Splenomegaly secondary to #1    Disposition: Alejandro Ward appears stable.  He is tolerating the ruxolitinib well.  The hemoglobin and platelet count are in goal range.  He will continue ruxolitinib at the current dose.  He will return for a lab visit in 1 month and 2 months.  He will be scheduled for a 57-month office visit.  Betsy Coder, MD  06/04/2021  9:10 AM

## 2021-06-05 ENCOUNTER — Other Ambulatory Visit (HOSPITAL_COMMUNITY): Payer: Self-pay

## 2021-06-05 ENCOUNTER — Other Ambulatory Visit: Payer: Self-pay

## 2021-06-05 DIAGNOSIS — Z5181 Encounter for therapeutic drug level monitoring: Secondary | ICD-10-CM

## 2021-06-10 ENCOUNTER — Encounter: Payer: Self-pay | Admitting: Internal Medicine

## 2021-06-10 DIAGNOSIS — Z1211 Encounter for screening for malignant neoplasm of colon: Secondary | ICD-10-CM

## 2021-06-17 NOTE — Addendum Note (Signed)
Addended by: Westley Hummer B on: 06/17/2021 07:11 AM   Modules accepted: Orders

## 2021-07-01 ENCOUNTER — Other Ambulatory Visit (HOSPITAL_COMMUNITY): Payer: Self-pay

## 2021-07-01 ENCOUNTER — Other Ambulatory Visit: Payer: Self-pay | Admitting: Oncology

## 2021-07-01 MED ORDER — RUXOLITINIB PHOSPHATE 10 MG PO TABS
10.0000 mg | ORAL_TABLET | Freq: Two times a day (BID) | ORAL | 0 refills | Status: DC
  Filled 2021-07-01: qty 60, 30d supply, fill #0

## 2021-07-03 ENCOUNTER — Other Ambulatory Visit (HOSPITAL_COMMUNITY): Payer: Self-pay

## 2021-07-07 ENCOUNTER — Other Ambulatory Visit: Payer: Self-pay

## 2021-07-07 ENCOUNTER — Inpatient Hospital Stay: Payer: Medicare Other | Attending: Hematology & Oncology

## 2021-07-07 DIAGNOSIS — D45 Polycythemia vera: Secondary | ICD-10-CM | POA: Insufficient documentation

## 2021-07-07 LAB — CBC WITH DIFFERENTIAL (CANCER CENTER ONLY)
Abs Immature Granulocytes: 0.07 10*3/uL (ref 0.00–0.07)
Basophils Absolute: 0.1 10*3/uL (ref 0.0–0.1)
Basophils Relative: 1 %
Eosinophils Absolute: 0.1 10*3/uL (ref 0.0–0.5)
Eosinophils Relative: 2 %
HCT: 41.4 % (ref 39.0–52.0)
Hemoglobin: 13 g/dL (ref 13.0–17.0)
Immature Granulocytes: 1 %
Lymphocytes Relative: 22 %
Lymphs Abs: 1.4 10*3/uL (ref 0.7–4.0)
MCH: 24.3 pg — ABNORMAL LOW (ref 26.0–34.0)
MCHC: 31.4 g/dL (ref 30.0–36.0)
MCV: 77.5 fL — ABNORMAL LOW (ref 80.0–100.0)
Monocytes Absolute: 0.5 10*3/uL (ref 0.1–1.0)
Monocytes Relative: 7 %
Neutro Abs: 4.4 10*3/uL (ref 1.7–7.7)
Neutrophils Relative %: 67 %
Platelet Count: 388 10*3/uL (ref 150–400)
RBC: 5.34 MIL/uL (ref 4.22–5.81)
Smear Review: NORMAL
WBC Count: 6.6 10*3/uL (ref 4.0–10.5)
nRBC: 0 % (ref 0.0–0.2)

## 2021-07-25 ENCOUNTER — Telehealth: Payer: Self-pay

## 2021-07-25 ENCOUNTER — Encounter (HOSPITAL_BASED_OUTPATIENT_CLINIC_OR_DEPARTMENT_OTHER): Payer: Self-pay

## 2021-07-25 ENCOUNTER — Other Ambulatory Visit: Payer: Self-pay

## 2021-07-25 ENCOUNTER — Emergency Department (HOSPITAL_BASED_OUTPATIENT_CLINIC_OR_DEPARTMENT_OTHER)
Admission: EM | Admit: 2021-07-25 | Discharge: 2021-07-25 | Disposition: A | Discharge status: Home / Self Care | Payer: Medicare Other | Attending: Emergency Medicine | Admitting: Emergency Medicine

## 2021-07-25 DIAGNOSIS — Z20822 Contact with and (suspected) exposure to covid-19: Secondary | ICD-10-CM | POA: Insufficient documentation

## 2021-07-25 DIAGNOSIS — Z7982 Long term (current) use of aspirin: Secondary | ICD-10-CM | POA: Insufficient documentation

## 2021-07-25 DIAGNOSIS — R6883 Chills (without fever): Secondary | ICD-10-CM | POA: Diagnosis present

## 2021-07-25 DIAGNOSIS — R6889 Other general symptoms and signs: Secondary | ICD-10-CM

## 2021-07-25 LAB — RESP PANEL BY RT-PCR (FLU A&B, COVID) ARPGX2
Influenza A by PCR: NEGATIVE
Influenza B by PCR: NEGATIVE
SARS Coronavirus 2 by RT PCR: NEGATIVE

## 2021-07-25 LAB — CBC WITH DIFFERENTIAL/PLATELET
Abs Immature Granulocytes: 0.06 10*3/uL (ref 0.00–0.07)
Basophils Absolute: 0.1 10*3/uL (ref 0.0–0.1)
Basophils Relative: 1 %
Eosinophils Absolute: 0 10*3/uL (ref 0.0–0.5)
Eosinophils Relative: 0 %
HCT: 36.9 % — ABNORMAL LOW (ref 39.0–52.0)
Hemoglobin: 12 g/dL — ABNORMAL LOW (ref 13.0–17.0)
Immature Granulocytes: 1 %
Lymphocytes Relative: 4 %
Lymphs Abs: 0.3 10*3/uL — ABNORMAL LOW (ref 0.7–4.0)
MCH: 26.2 pg (ref 26.0–34.0)
MCHC: 32.5 g/dL (ref 30.0–36.0)
MCV: 80.6 fL (ref 80.0–100.0)
Monocytes Absolute: 0.3 10*3/uL (ref 0.1–1.0)
Monocytes Relative: 4 %
Neutro Abs: 6.6 10*3/uL (ref 1.7–7.7)
Neutrophils Relative %: 90 %
Platelets: 282 10*3/uL (ref 150–400)
RBC: 4.58 MIL/uL (ref 4.22–5.81)
RDW: 26.4 % — ABNORMAL HIGH (ref 11.5–15.5)
WBC: 7.3 10*3/uL (ref 4.0–10.5)
nRBC: 0 % (ref 0.0–0.2)

## 2021-07-25 LAB — COMPREHENSIVE METABOLIC PANEL
ALT: 21 U/L (ref 0–44)
AST: 29 U/L (ref 15–41)
Albumin: 4.2 g/dL (ref 3.5–5.0)
Alkaline Phosphatase: 57 U/L (ref 38–126)
Anion gap: 9 (ref 5–15)
BUN: 23 mg/dL (ref 8–23)
CO2: 24 mmol/L (ref 22–32)
Calcium: 9 mg/dL (ref 8.9–10.3)
Chloride: 103 mmol/L (ref 98–111)
Creatinine, Ser: 1.25 mg/dL — ABNORMAL HIGH (ref 0.61–1.24)
GFR, Estimated: >60 mL/min (ref >60)
Glucose, Bld: 192 mg/dL — ABNORMAL HIGH (ref 70–99)
Potassium: 4 mmol/L (ref 3.5–5.1)
Sodium: 136 mmol/L (ref 135–145)
Total Bilirubin: 0.5 mg/dL (ref 0.3–1.2)
Total Protein: 6.4 g/dL — ABNORMAL LOW (ref 6.5–8.1)

## 2021-07-25 NOTE — Discharge Instructions (Addendum)
Work-up here today without any significant findings.  COVID testing influenza testing negative.  No electrolyte abnormalities.  Would recommend symptomatic treatment as needed and return for any new or worse symptoms.  In addition your complete blood counts are normal as well.

## 2021-07-25 NOTE — ED Provider Notes (Signed)
[place] EMERGENCY DEPT Provider Note   CSN: [id number] Arrival date & time: 07/25/21  1341     History Chief Complaint  Patient presents with   Chills    Alejandro Ward is a 70 y.o. male.  Patient with sudden onset of chills and body aches that were pretty severe right after lunch.  Complaining of upset stomach no vomiting no diarrhea.  Patient is worried about some food poisoning from a trip couple days ago.  Past medical history sniffing for polycythemia vera.  Patient took Motrin and now feels better.  No documented fever.      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 Authorizing Provider  aspirin 81 MG EC tablet Take by mouth.    [provider]  ruxolitinib phosphate (JAKAFI) 10 MG tablet Take 1 tablet (10 mg total) by mouth 2 (two) times daily. 07/01/21   Ladell Pier, MD    Allergies    Patient has no known allergies.  Review of Systems   Review of Systems  Constitutional:  Positive for chills. Negative for fever.  HENT:   Negative for ear pain and sore throat.   Eyes:  Negative for pain and visual disturbance.  Respiratory:  Negative for cough and shortness of breath.   Cardiovascular:  Negative for chest pain and palpitations.  Gastrointestinal:  Negative for abdominal pain, diarrhea, nausea and vomiting.  Genitourinary:  Negative for dysuria and hematuria.  Musculoskeletal:  Positive for myalgias. Negative for arthralgias and back pain.  Skin:  Negative for color change and rash.  Neurological:  Negative for seizures and syncope.  All other systems reviewed and are negative.  Physical Exam Updated Vital Signs BP (!) 144/78 (BP Location: Right Arm)    Pulse 72    Temp 98.3 F (36.8 C)    Resp 18    Ht 1.803 m (5\' 11" )    Wt 82.6 kg    SpO2 95%    BMI 25.38 kg/m   Physical Exam Vitals and nursing note reviewed.  Constitutional:      General: He is not in acute distress.    Appearance: Normal appearance. He is well-developed.  HENT:     Head: Normocephalic and atraumatic.  Eyes:     Extraocular Movements: Extraocular movements intact.     Conjunctiva/sclera: Conjunctivae normal.     Pupils: Pupils are equal, round, and reactive to light.  Cardiovascular:     Rate and Rhythm: Normal rate and regular rhythm.     Heart sounds: No murmur heard. Pulmonary:     Effort: Pulmonary effort is normal. No respiratory distress.     Breath sounds: Normal breath sounds.  Abdominal:     Palpations: Abdomen is soft.     Tenderness: There is no abdominal tenderness.  Musculoskeletal:        General: No swelling.     Cervical back: Normal range of motion and neck supple.  Skin:    General: Skin is warm and dry.     Capillary Refill: Capillary refill takes less than 2 seconds.  Neurological:     General: No focal deficit present.     Mental Status: He is alert and oriented to person, place, and time.     Cranial Nerves: No cranial nerve deficit.     Sensory: No sensory deficit.  Psychiatric:        Mood  and Affect: Mood normal.    ED Results / Procedures / Treatments   Labs (all labs ordered are listed, but only abnormal results are displayed) Labs Reviewed  CBC WITH DIFFERENTIAL/PLATELET - Abnormal; Notable for the following components:      Result Value   Hemoglobin 12.0 (*)    HCT 36.9 (*)    RDW 26.4 (*)    Lymphs Abs 0.3 (*)    All other components within normal limits  COMPREHENSIVE METABOLIC PANEL - Abnormal; Notable for the following components:   Glucose, Bld 192 (*)    Creatinine, Ser 1.25 (*)    Total Protein 6.4 (*)    All other components within normal limits  RESP PANEL BY RT-PCR (FLU A&B, COVID) ARPGX2    EKG None  Radiology No results found.  Procedures Procedures   Medications Ordered in ED Medications - No data to display  ED Course  I have reviewed the triage vital signs and the nursing notes.  Pertinent labs & imaging results that were available during my care of the patient were reviewed by me and considered in my medical decision making (see chart for details).    MDM Rules/Calculators/A&P                         Patient nontoxic no acute distress.  Has been asymptomatic here.  Patient's labs to include complete metabolic panel are completely normal.  Patient has a history of polycythemia vera but his CBC has a white blood cell count of 7.3.  Hemoglobin 12.2.  Platelets 282.  COVID and flu testing negative.  Patient stable for discharge home symptomatic treatment returning for any new or worse symptoms.  Seems like a flulike illness.   Final Clinical Impression(s) / ED Diagnoses Final diagnoses:  Flu-like symptoms    Rx / DC Orders ED Discharge Orders     None        Fredia Sorrow, MD 07/25/21 1744

## 2021-07-25 NOTE — Telephone Encounter (Signed)
Pt's wife Left v/m message  stating Pt was having chills and shaking.Return call to Pt, Pt was already in the ER. Informed Pt's wife Dr. Benay Spice is aware he is there.

## 2021-07-25 NOTE — ED Notes (Signed)
Urine sample to lab to hold

## 2021-07-25 NOTE — ED Notes (Signed)
Pt has been up to the desk multiple times complaining about how long they have been here. Pt requesting to leave and either they call back for the results or the EDP calls them with the results. Pt advised we're not able to do that here. Encouraged by this RN and EDP Zackowski the importance of staying for lab results

## 2021-07-25 NOTE — ED Triage Notes (Signed)
Pt c/o chills and body aches that started suddenly after lunch. Pt also c/o upset stomach, states he is worried about food poisoning.

## 2021-07-29 ENCOUNTER — Other Ambulatory Visit: Payer: Self-pay | Admitting: Oncology

## 2021-07-29 ENCOUNTER — Other Ambulatory Visit (HOSPITAL_COMMUNITY): Payer: Self-pay

## 2021-07-29 MED ORDER — RUXOLITINIB PHOSPHATE 10 MG PO TABS
10.0000 mg | ORAL_TABLET | Freq: Two times a day (BID) | ORAL | 1 refills | Status: DC
  Filled 2021-08-06: qty 60, 30d supply, fill #0
  Filled 2021-08-28: qty 60, 30d supply, fill #1

## 2021-07-30 ENCOUNTER — Other Ambulatory Visit (HOSPITAL_COMMUNITY): Payer: Self-pay

## 2021-07-30 ENCOUNTER — Telehealth: Payer: Self-pay | Admitting: Oncology

## 2021-08-06 ENCOUNTER — Other Ambulatory Visit (HOSPITAL_COMMUNITY): Payer: Self-pay

## 2021-08-07 ENCOUNTER — Inpatient Hospital Stay: Payer: Medicare Other | Attending: Hematology & Oncology

## 2021-08-07 ENCOUNTER — Telehealth: Payer: Self-pay

## 2021-08-07 ENCOUNTER — Other Ambulatory Visit: Payer: Self-pay

## 2021-08-07 DIAGNOSIS — D45 Polycythemia vera: Secondary | ICD-10-CM | POA: Diagnosis present

## 2021-08-07 LAB — CBC WITH DIFFERENTIAL (CANCER CENTER ONLY)
Abs Immature Granulocytes: 0.15 10*3/uL — ABNORMAL HIGH (ref 0.00–0.07)
Basophils Absolute: 0.1 10*3/uL (ref 0.0–0.1)
Basophils Relative: 1 %
Eosinophils Absolute: 0.1 10*3/uL (ref 0.0–0.5)
Eosinophils Relative: 2 %
HCT: 37 % — ABNORMAL LOW (ref 39.0–52.0)
Hemoglobin: 12.1 g/dL — ABNORMAL LOW (ref 13.0–17.0)
Immature Granulocytes: 2 %
Lymphocytes Relative: 19 %
Lymphs Abs: 1.4 10*3/uL (ref 0.7–4.0)
MCH: 27.5 pg (ref 26.0–34.0)
MCHC: 32.7 g/dL (ref 30.0–36.0)
MCV: 84.1 fL (ref 80.0–100.0)
Monocytes Absolute: 0.5 10*3/uL (ref 0.1–1.0)
Monocytes Relative: 6 %
Neutro Abs: 5.2 10*3/uL (ref 1.7–7.7)
Neutrophils Relative %: 70 %
Platelet Count: 401 10*3/uL — ABNORMAL HIGH (ref 150–400)
RBC: 4.4 MIL/uL (ref 4.22–5.81)
RDW: 23.5 % — ABNORMAL HIGH (ref 11.5–15.5)
WBC Count: 7.4 10*3/uL (ref 4.0–10.5)
nRBC: 0 % (ref 0.0–0.2)

## 2021-08-07 LAB — CMP (CANCER CENTER ONLY)
ALT: 28 U/L (ref 0–44)
AST: 34 U/L (ref 15–41)
Albumin: 4 g/dL (ref 3.5–5.0)
Alkaline Phosphatase: 57 U/L (ref 38–126)
Anion gap: 5 (ref 5–15)
BUN: 23 mg/dL (ref 8–23)
CO2: 29 mmol/L (ref 22–32)
Calcium: 9.1 mg/dL (ref 8.9–10.3)
Chloride: 106 mmol/L (ref 98–111)
Creatinine: 1.24 mg/dL (ref 0.61–1.24)
GFR, Estimated: >60 mL/min (ref >60)
Glucose, Bld: 103 mg/dL — ABNORMAL HIGH (ref 70–99)
Potassium: 4.4 mmol/L (ref 3.5–5.1)
Sodium: 140 mmol/L (ref 135–145)
Total Bilirubin: 0.5 mg/dL (ref 0.3–1.2)
Total Protein: 6.3 g/dL — ABNORMAL LOW (ref 6.5–8.1)

## 2021-08-07 NOTE — Telephone Encounter (Signed)
V/M message left for Pt relaying message and if any questions or concerns to give a return call.

## 2021-08-07 NOTE — Telephone Encounter (Signed)
-----   Message from Ladell Pier, MD sent at 08/07/2021  1:29 PM EST ----- Please call patient, hemoglobin and platelet count are in goal range, continue ruxolitinib, follow-up as scheduled

## 2021-08-28 ENCOUNTER — Other Ambulatory Visit (HOSPITAL_COMMUNITY): Payer: Self-pay

## 2021-09-03 ENCOUNTER — Encounter: Payer: Self-pay | Admitting: Oncology

## 2021-09-03 ENCOUNTER — Other Ambulatory Visit (HOSPITAL_COMMUNITY): Payer: Self-pay

## 2021-09-08 ENCOUNTER — Other Ambulatory Visit: Payer: Medicare Other

## 2021-09-08 ENCOUNTER — Ambulatory Visit: Payer: Medicare Other | Admitting: Oncology

## 2021-09-12 ENCOUNTER — Encounter: Payer: Self-pay | Admitting: Family

## 2021-09-12 NOTE — Telephone Encounter (Signed)
Left message appointment 2/6 has been changes to 2/13@7 :45 and 8:00 with Dr Benay Spice

## 2021-09-15 ENCOUNTER — Encounter: Payer: Self-pay | Admitting: Oncology

## 2021-09-15 ENCOUNTER — Other Ambulatory Visit: Payer: Self-pay

## 2021-09-15 ENCOUNTER — Inpatient Hospital Stay (HOSPITAL_BASED_OUTPATIENT_CLINIC_OR_DEPARTMENT_OTHER): Payer: Medicare Other | Admitting: Oncology

## 2021-09-15 ENCOUNTER — Inpatient Hospital Stay: Payer: Medicare Other | Attending: Hematology & Oncology

## 2021-09-15 VITALS — BP 144/59 | HR 58 | Temp 98.1°F | Resp 18 | Ht 71.0 in | Wt 197.2 lb

## 2021-09-15 DIAGNOSIS — D45 Polycythemia vera: Secondary | ICD-10-CM

## 2021-09-15 DIAGNOSIS — R161 Splenomegaly, not elsewhere classified: Secondary | ICD-10-CM | POA: Insufficient documentation

## 2021-09-15 DIAGNOSIS — Z79899 Other long term (current) drug therapy: Secondary | ICD-10-CM | POA: Insufficient documentation

## 2021-09-15 DIAGNOSIS — E611 Iron deficiency: Secondary | ICD-10-CM | POA: Diagnosis not present

## 2021-09-15 LAB — CBC WITH DIFFERENTIAL (CANCER CENTER ONLY)
Abs Immature Granulocytes: 0.09 10*3/uL — ABNORMAL HIGH (ref 0.00–0.07)
Basophils Absolute: 0.1 10*3/uL (ref 0.0–0.1)
Basophils Relative: 1 %
Eosinophils Absolute: 0.1 10*3/uL (ref 0.0–0.5)
Eosinophils Relative: 2 %
HCT: 38.1 % — ABNORMAL LOW (ref 39.0–52.0)
Hemoglobin: 12.5 g/dL — ABNORMAL LOW (ref 13.0–17.0)
Immature Granulocytes: 1 %
Lymphocytes Relative: 19 %
Lymphs Abs: 1.2 10*3/uL (ref 0.7–4.0)
MCH: 29.7 pg (ref 26.0–34.0)
MCHC: 32.8 g/dL (ref 30.0–36.0)
MCV: 90.5 fL (ref 80.0–100.0)
Monocytes Absolute: 0.5 10*3/uL (ref 0.1–1.0)
Monocytes Relative: 7 %
Neutro Abs: 4.5 10*3/uL (ref 1.7–7.7)
Neutrophils Relative %: 70 %
Platelet Count: 319 10*3/uL (ref 150–400)
RBC: 4.21 MIL/uL — ABNORMAL LOW (ref 4.22–5.81)
RDW: 17.3 % — ABNORMAL HIGH (ref 11.5–15.5)
WBC Count: 6.5 10*3/uL (ref 4.0–10.5)
nRBC: 0 % (ref 0.0–0.2)

## 2021-09-15 LAB — CMP (CANCER CENTER ONLY)
ALT: 21 U/L (ref 0–44)
AST: 31 U/L (ref 15–41)
Albumin: 4.1 g/dL (ref 3.5–5.0)
Alkaline Phosphatase: 51 U/L (ref 38–126)
Anion gap: 10 (ref 5–15)
BUN: 18 mg/dL (ref 8–23)
CO2: 23 mmol/L (ref 22–32)
Calcium: 9.1 mg/dL (ref 8.9–10.3)
Chloride: 110 mmol/L (ref 98–111)
Creatinine: 1.2 mg/dL (ref 0.61–1.24)
GFR, Estimated: >60 mL/min (ref >60)
Glucose, Bld: 72 mg/dL (ref 70–99)
Potassium: 4.7 mmol/L (ref 3.5–5.1)
Sodium: 143 mmol/L (ref 135–145)
Total Bilirubin: 0.5 mg/dL (ref 0.3–1.2)
Total Protein: 6.3 g/dL — ABNORMAL LOW (ref 6.5–8.1)

## 2021-09-15 NOTE — Progress Notes (Signed)
°  Alejandro Ward   Diagnosis: Polycythemia vera  INTERVAL HISTORY:   Alejandro Ward returns as scheduled.  He continues ruxolitinib.  He reports frequent bowel movements and increased "gas ".  He bruises easily.  No other bleeding.  He has gained approximately 12 pounds since starting ruxolitinib.  Objective:  Vital signs in last 24 hours:  Blood pressure (!) 144/59, pulse (!) 58, temperature 98.1 F (36.7 C), temperature source Oral, resp. rate 18, height 5\' 11"  (1.803 m), weight 197 lb 3.2 oz (89.4 kg), SpO2 99 %.    HEENT: No thrush or bleeding Resp: Lungs clear bilaterally Cardio: Regular rate and rhythm GI: No hepatosplenomegaly Vascular: No leg edema  Skin: No ecchymoses Lab Results:  Lab Results  Component Value Date   WBC 6.5 09/15/2021   HGB 12.5 (L) 09/15/2021   HCT 38.1 (L) 09/15/2021   MCV 90.5 09/15/2021   PLT 319 09/15/2021   NEUTROABS 4.5 09/15/2021    CMP  Lab Results  Component Value Date   NA 140 08/07/2021   K 4.4 08/07/2021   CL 106 08/07/2021   CO2 29 08/07/2021   GLUCOSE 103 (H) 08/07/2021   BUN 23 08/07/2021   CREATININE 1.24 08/07/2021   CALCIUM 9.1 08/07/2021   PROT 6.3 (L) 08/07/2021   ALBUMIN 4.0 08/07/2021   AST 34 08/07/2021   ALT 28 08/07/2021   ALKPHOS 57 08/07/2021   BILITOT 0.5 08/07/2021   GFRNONAA >60 08/07/2021   GFRAA >60 04/16/2020    Medications: I have reviewed the patient's current medications.   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  He complains of weight gain, frequent bowel movements, and easy bruising.  He relates the symptoms to the ruxolitinib.  Ruxolitinib will be placed on hold.  The hemoglobin and platelet count are in goal range.  He will return for an office and lab visit in 1 month.  Betsy Coder, MD  09/15/2021  8:05 AM

## 2021-09-17 ENCOUNTER — Other Ambulatory Visit: Payer: Self-pay | Admitting: *Deleted

## 2021-09-17 ENCOUNTER — Other Ambulatory Visit (HOSPITAL_COMMUNITY): Payer: Self-pay

## 2021-09-17 MED ORDER — RUXOLITINIB PHOSPHATE 5 MG PO TABS
5.0000 mg | ORAL_TABLET | ORAL | 0 refills | Status: DC
  Filled 2021-09-17: qty 36, fill #0
  Filled 2021-09-29: qty 36, 28d supply, fill #0

## 2021-09-17 NOTE — Progress Notes (Signed)
New script for 5 mg Jakafi sent to [facility] to accommodate dose reduction.

## 2021-09-29 ENCOUNTER — Other Ambulatory Visit (HOSPITAL_COMMUNITY): Payer: Self-pay

## 2021-10-14 ENCOUNTER — Inpatient Hospital Stay: Payer: Medicare Other | Attending: Hematology & Oncology

## 2021-10-14 ENCOUNTER — Inpatient Hospital Stay (HOSPITAL_BASED_OUTPATIENT_CLINIC_OR_DEPARTMENT_OTHER): Payer: Medicare Other | Admitting: Oncology

## 2021-10-14 ENCOUNTER — Other Ambulatory Visit: Payer: Self-pay

## 2021-10-14 VITALS — BP 135/72 | HR 56 | Temp 98.9°F | Resp 20 | Ht 71.0 in | Wt 191.0 lb

## 2021-10-14 DIAGNOSIS — E611 Iron deficiency: Secondary | ICD-10-CM | POA: Insufficient documentation

## 2021-10-14 DIAGNOSIS — D45 Polycythemia vera: Secondary | ICD-10-CM | POA: Diagnosis present

## 2021-10-14 DIAGNOSIS — Z79899 Other long term (current) drug therapy: Secondary | ICD-10-CM | POA: Diagnosis not present

## 2021-10-14 DIAGNOSIS — R161 Splenomegaly, not elsewhere classified: Secondary | ICD-10-CM | POA: Insufficient documentation

## 2021-10-14 LAB — CBC WITH DIFFERENTIAL (CANCER CENTER ONLY)
Abs Immature Granulocytes: 0.04 10*3/uL (ref 0.00–0.07)
Basophils Absolute: 0.1 10*3/uL (ref 0.0–0.1)
Basophils Relative: 2 %
Eosinophils Absolute: 0.1 10*3/uL (ref 0.0–0.5)
Eosinophils Relative: 2 %
HCT: 40.3 % (ref 39.0–52.0)
Hemoglobin: 13.4 g/dL (ref 13.0–17.0)
Immature Granulocytes: 1 %
Lymphocytes Relative: 19 %
Lymphs Abs: 1.2 10*3/uL (ref 0.7–4.0)
MCH: 30.5 pg (ref 26.0–34.0)
MCHC: 33.3 g/dL (ref 30.0–36.0)
MCV: 91.8 fL (ref 80.0–100.0)
Monocytes Absolute: 0.5 10*3/uL (ref 0.1–1.0)
Monocytes Relative: 8 %
Neutro Abs: 4.6 10*3/uL (ref 1.7–7.7)
Neutrophils Relative %: 68 %
Platelet Count: 321 10*3/uL (ref 150–400)
RBC: 4.39 MIL/uL (ref 4.22–5.81)
RDW: 15.4 % (ref 11.5–15.5)
WBC Count: 6.6 10*3/uL (ref 4.0–10.5)
nRBC: 0 % (ref 0.0–0.2)

## 2021-10-14 NOTE — Progress Notes (Signed)
?  [place] ?OFFICE PROGRESS NOTE ? ? ?Diagnosis: Polycythemia vera ? ?INTERVAL HISTORY:  ? ?Mr. Spychalski returns as scheduled.  He has weaned the [facility] down to 5 mg daily.  He has noted improvement in weight gain and stool frequency with the decreased dose.  No bleeding or symptom of thrombosis.  No pruritus. ? ?Objective: ? ?Vital signs in last 24 hours: ? ?Blood pressure 135/72, pulse (!) 56, temperature 98.9 ?F (37.2 ?C), temperature source Oral, resp. rate 20, height '5\' 11"'$  (1.803 m), weight 191 lb (86.6 kg), SpO2 98 %. ?  ?Resp: Distant breath sounds, lungs clear bilaterally ?Cardio: Regular rate and rhythm ?GI: No hepatosplenomegaly ?Vascular: No leg edema ? ? ?Lab Results: ? ?Lab Results  ?Component Value Date  ? WBC 6.6 10/14/2021  ? HGB 13.4 10/14/2021  ? HCT 40.3 10/14/2021  ? MCV 91.8 10/14/2021  ? PLT 321 10/14/2021  ? NEUTROABS 4.6 10/14/2021  ? ? ?CMP  ?Lab Results  ?Component Value Date  ? NA 143 09/15/2021  ? K 4.7 09/15/2021  ? CL 110 09/15/2021  ? CO2 23 09/15/2021  ? GLUCOSE 72 09/15/2021  ? BUN 18 09/15/2021  ? CREATININE 1.20 09/15/2021  ? CALCIUM 9.1 09/15/2021  ? PROT 6.3 (L) 09/15/2021  ? ALBUMIN 4.1 09/15/2021  ? AST 31 09/15/2021  ? ALT 21 09/15/2021  ? ALKPHOS 51 09/15/2021  ? BILITOT 0.5 09/15/2021  ? GFRNONAA >60 09/15/2021  ? GFRAA >60 04/16/2020  ? ? ?No results found for: CEA1, CEA, [id number], CA125 ? ?Lab Results  ?Component Value Date  ? INR 1.1 04/04/2020  ? LABPROT 13.8 04/04/2020  ? ? ?Imaging: ? ?No results found. ? ?Medications: I have reviewed the patient's current medications. ? ? ?Assessment/Plan: ?Polycythemia vera, JAK2 positive ?Maintained on intermittent phlebotomy therapy since diagnosis in late 2019 ?Trial of hydroxyurea 02/08/2021, discontinued after approximately 5 days secondary to nausea, diarrhea, and pruritus ?Hydroxyurea 500 mg every other day starting 02/18/2021-he did not restart hydroxyurea ?Jakafi 10 mg twice daily 04/10/2021 ?Phlebotomy  05/01/2021 ?Jakafi dose weaned beginning 09/15/2021, now taking 5 mg daily ?Thrombocytosis secondary to #1 and iron deficiency ?Iron deficiency secondary to polycythemia and phlebotomy ?COVID-19 infection May 2022 ?Right leg cellulitis 2020 ?Splenomegaly secondary to #1 ? ? ? ? ? ?Disposition: ?Mr. Joye appears stable.  He has decreased the dose of ruxolitinib to 5 mg daily.  The hemoglobin remains in goal range.  He will continue ruxolitinib at the current dose.  He will return for an office visit and CBC in approximately 5 weeks. ? ?Betsy Coder, MD ? ?10/14/2021  ?8:29 AM ? ? ?

## 2021-10-16 ENCOUNTER — Ambulatory Visit (INDEPENDENT_AMBULATORY_CARE_PROVIDER_SITE_OTHER): Payer: Medicare Other | Admitting: Internal Medicine

## 2021-10-16 ENCOUNTER — Encounter: Payer: Self-pay | Admitting: Internal Medicine

## 2021-10-16 VITALS — BP 120/70 | HR 60 | Temp 97.8°F | Ht 74.0 in | Wt 194.2 lb

## 2021-10-16 DIAGNOSIS — Z125 Encounter for screening for malignant neoplasm of prostate: Secondary | ICD-10-CM

## 2021-10-16 DIAGNOSIS — D45 Polycythemia vera: Secondary | ICD-10-CM | POA: Diagnosis not present

## 2021-10-16 DIAGNOSIS — Z0000 Encounter for general adult medical examination without abnormal findings: Secondary | ICD-10-CM

## 2021-10-16 DIAGNOSIS — Z1211 Encounter for screening for malignant neoplasm of colon: Secondary | ICD-10-CM | POA: Diagnosis not present

## 2021-10-16 NOTE — Patient Instructions (Signed)
-  Nice seeing you today!! ? ?-Remember your tdap and shingles vaccines. ? ?-Will follow up regarding your colonoscopy. ? ?-Schedule follow up in 1 year or sooner as needed. ? ? ?

## 2021-10-16 NOTE — Addendum Note (Signed)
Addended by: Westley Hummer B on: 10/16/2021 08:04 AM ? ? Modules accepted: Orders ? ?

## 2021-10-16 NOTE — Progress Notes (Signed)
? ? ? ?established Patient Office Visit ? ? ? ? ?This visit occurred during the SARS-CoV-2 public health emergency.  Safety protocols were in place, including screening questions prior to the visit, additional usage of staff PPE, and extensive cleaning of exam room while observing appropriate contact time as indicated for disinfecting solutions.  ? ? ?CC/Reason for Visit: Subsequent Medicare wellness visit ? ?HPI: Alejandro Ward is a 71 y.o. male who is coming in today for the above mentioned reasons. Past Medical History is significant for: Polycythemia vera.  He is followed by Dr. Ammie Dalton and is currently on aspirin and JAFAKI.  He is doing well.  He has routine eye and dental care.  He has bilateral hearing difficulty and wears a hearing aid.  He has had a COVID booster, he is overdue for Tdap and shingles.  He has never had a colonoscopy.  He visited the emergency department in December with chills, was diagnosed with an unspecified viral illness.  This lasted less than 24 hours.  He tells me he plans on moving to [facility].  He is still working as an Forensic psychologist. ? ? ?Past Medical/Surgical History: ?Past Medical History:  ?Diagnosis Date  ? Polycythemia vera ([place])   ? ? ?Past Surgical History:  ?Procedure Laterality Date  ? NOSE SURGERY    ? broke as a child in football  ? ? ?Social History: ? reports that he has never smoked. He has never used smokeless tobacco. He reports current alcohol use. No history on file for drug use. ? ?Allergies: ?No Known Allergies ? ?Family History:  ?Family History  ?Problem Relation Age of Onset  ? Diabetes Mother   ? Hypertension Mother   ? Heart attack Father   ? Hyperlipidemia Neg Hx   ? Sudden death Neg Hx   ? ? ? ?Current Outpatient Medications:  ?  aspirin 81 MG EC tablet, Take by mouth., Disp: , Rfl:  ?  ruxolitinib phosphate (JAKAFI) 5 MG tablet, Take 1 tablet (5 mg total) by mouth as directed. Take 5 mg twice daily x 1 week, then 5 mg daily, Disp:  36 tablet, Rfl: 0 ? ?Review of Systems:  ?Constitutional: Denies fever, chills, diaphoresis, appetite change and fatigue.  ?HEENT: Denies photophobia, eye pain, redness, hearing loss, ear pain, congestion, sore throat, rhinorrhea, sneezing, mouth sores, trouble swallowing, neck pain, neck stiffness and tinnitus.   ?Respiratory: Denies SOB, DOE, cough, chest tightness,  and wheezing.   ?Cardiovascular: Denies chest pain, palpitations and leg swelling.  ?Gastrointestinal: Denies nausea, vomiting, abdominal pain, diarrhea, constipation, blood in stool and abdominal distention.  ?Genitourinary: Denies dysuria, urgency, frequency, hematuria, flank pain and difficulty urinating.  ?Endocrine: Denies: hot or cold intolerance, sweats, changes in hair or nails, polyuria, polydipsia. ?Musculoskeletal: Denies myalgias, back pain, joint swelling, arthralgias and gait problem.  ?Skin: Denies pallor, rash and wound.  ?Neurological: Denies dizziness, seizures, syncope, weakness, light-headedness, numbness and headaches.  ?Hematological: Denies adenopathy. Easy bruising, personal or family bleeding history  ?Psychiatric/Behavioral: Denies suicidal ideation, mood changes, confusion, nervousness, sleep disturbance and agitation ? ? ? ?Physical Exam: ?Vitals:  ? 10/16/21 0707  ?BP: 120/70  ?Pulse: 60  ?Temp: 97.8 ?F (36.6 ?C)  ?TempSrc: Oral  ?SpO2: 97%  ?Weight: 194 lb 3.2 oz (88.1 kg)  ? ? ?Body mass index is 27.09 kg/m?. ? ? ?Constitutional: NAD, calm, comfortable ?Eyes: PERRL, lids and conjunctivae normal, wears corrective lenses ?ENMT: Mucous membranes are moist. Posterior pharynx clear of any exudate or lesions.  Normal dentition. Tympanic membrane is pearly white, no erythema or bulging. ?Neck: normal, supple, no masses, no thyromegaly ?Respiratory: clear to auscultation bilaterally, no wheezing, no crackles. Normal respiratory effort. No accessory muscle use.  ?Cardiovascular: Regular rate and rhythm, no murmurs / rubs /  gallops. No extremity edema. 2+ pedal pulses. No carotid bruits.  ?Abdomen: no tenderness, no masses palpated. No hepatosplenomegaly. Bowel sounds positive.  ?Musculoskeletal: no clubbing / cyanosis. No joint deformity upper and lower extremities. Good ROM, no contractures. Normal muscle tone.  ?Skin: no rashes, lesions, ulcers. No induration ?Neurologic: CN 2-12 grossly intact. Sensation intact, DTR normal. Strength 5/5 in all 4.  ?Psychiatric: Normal judgment and insight. Alert and oriented x 3. Normal mood.  ? ? ?Subsequent Medicare wellness visit ?  ?1. Risk factors, based on past  M,S,F -cardiovascular disease risk factors include age and gender ?  ?2.  Physical activities: He walks approximately 30 minutes a day ?  ?3.  Depression/mood: Stable, not depressed ?  ?4.  Hearing: Decreased bilaterally, wears hearing aids ?  ?5.  ADL's: Independent in all ADLs ?  ?6.  Fall risk: Low fall risk ?  ?7.  Home safety: No problems identified ?  ?8.  Height weight, and visual acuity: height and weight as above, vision: ? ?Vision Screening  ? Right eye Left eye Both eyes  ?Without correction '20/16 20/16 20/16 '$  ?With correction     ? ?  ?9.  Counseling: Advised to update vaccination status ?  ?10. Lab orders based on risk factors: Laboratory update will be reviewed ?  ?11. Referral : None today ?  ?12. Care plan: Follow-up with me in 1 year or sooner as needed ?  ?13. Cognitive assessment: No cognitive impairment ?  ?14. Screening: Patient provided with a written and personalized 5-10 year screening schedule in the AVS. yes ?  ?15. Provider List Update: PCP, hematologist Dr. Ammie Dalton ? ?16. Advance Directives: Full code ? ? ?17. Opioids: Patient is not on any opioid prescriptions and has no risk factors for a substance use disorder. ? ? ?[place] Office Visit from 10/16/2021 in [place] at [place]  ?PHQ-9 Total Score 0  ? ?  ? ? ?Fall Risk 05/01/2021 07/25/2021 09/15/2021 10/14/2021 10/16/2021  ?Falls in the past  year? - - - - 0  ?Was there an injury with Fall? - - - - 0  ?Fall Risk Category Calculator - - - - 0  ?Fall Risk Category - - - - Low  ?Patient Fall Risk Level Low fall risk Low fall risk Low fall risk Low fall risk Low fall risk  ?Patient at Risk for Falls Due to - - - - No Fall Risks  ?Fall risk Follow up - - - - Falls evaluation completed  ? ? ? ?Impression and Plan: ? ?Medicare annual wellness visit, subsequent  ?- Plan: Comprehensive metabolic panel, Lipid panel ?-Recommend routine eye and dental care. ?-Immunizations: He will get Tdap and shingles at pharmacy, he will provide proof that he received last COVID booster. ?-Healthy lifestyle discussed in detail. ?-Since he gets lab draws monthly with hematology, he is requesting no blood draws today, labs will be ordered to see if they can be added to labs done by hematology next month. ?-Colon cancer screening: Referral to GI ?-Breast cancer screening: Not applicable ?-Cervical cancer screening: Not applicable ?-Lung cancer screening: Not applicable ?-Prostate cancer screening: PSA with next lab draw ?-DEXA: Not applicable ? ? ?Polycythemia vera ([place]). ?-Followed by  hematology. ? ?Prostate cancer screening ?-PSA with next lab draw ? ? ? ?Patient Instructions  ?-Nice seeing you today!! ? ?-Remember your tdap and shingles vaccines. ? ?-Will follow up regarding your colonoscopy. ? ?-Schedule follow up in 1 year or sooner as needed. ? ? ? ? ? ?Lelon Frohlich, MD ?[facility] ? ? ?

## 2021-10-21 ENCOUNTER — Encounter: Payer: Medicare Other | Admitting: Internal Medicine

## 2021-10-28 ENCOUNTER — Other Ambulatory Visit (HOSPITAL_COMMUNITY): Payer: Self-pay

## 2021-11-14 ENCOUNTER — Other Ambulatory Visit (HOSPITAL_COMMUNITY): Payer: Self-pay

## 2021-11-18 ENCOUNTER — Other Ambulatory Visit: Payer: Medicare Other

## 2021-11-18 ENCOUNTER — Ambulatory Visit: Payer: Medicare Other | Admitting: Nurse Practitioner

## 2021-11-21 ENCOUNTER — Ambulatory Visit: Payer: Medicare Other | Admitting: Nurse Practitioner

## 2021-11-21 ENCOUNTER — Other Ambulatory Visit: Payer: Medicare Other

## 2021-12-01 ENCOUNTER — Other Ambulatory Visit (HOSPITAL_COMMUNITY): Payer: Self-pay

## 2021-12-01 ENCOUNTER — Inpatient Hospital Stay (HOSPITAL_BASED_OUTPATIENT_CLINIC_OR_DEPARTMENT_OTHER): Payer: Medicare Other | Admitting: Oncology

## 2021-12-01 ENCOUNTER — Encounter: Payer: Self-pay | Admitting: Oncology

## 2021-12-01 ENCOUNTER — Inpatient Hospital Stay: Payer: Medicare Other | Attending: Hematology & Oncology

## 2021-12-01 VITALS — BP 134/74 | HR 63 | Temp 98.1°F | Resp 18 | Ht 74.0 in | Wt 193.4 lb

## 2021-12-01 DIAGNOSIS — D45 Polycythemia vera: Secondary | ICD-10-CM

## 2021-12-01 DIAGNOSIS — E611 Iron deficiency: Secondary | ICD-10-CM | POA: Insufficient documentation

## 2021-12-01 DIAGNOSIS — R197 Diarrhea, unspecified: Secondary | ICD-10-CM | POA: Insufficient documentation

## 2021-12-01 LAB — CBC WITH DIFFERENTIAL (CANCER CENTER ONLY)
Abs Immature Granulocytes: 0.04 10*3/uL (ref 0.00–0.07)
Basophils Absolute: 0.2 10*3/uL — ABNORMAL HIGH (ref 0.0–0.1)
Basophils Relative: 2 %
Eosinophils Absolute: 0.2 10*3/uL (ref 0.0–0.5)
Eosinophils Relative: 3 %
HCT: 43.5 % (ref 39.0–52.0)
Hemoglobin: 13.8 g/dL (ref 13.0–17.0)
Immature Granulocytes: 1 %
Lymphocytes Relative: 14 %
Lymphs Abs: 1.1 10*3/uL (ref 0.7–4.0)
MCH: 27 pg (ref 26.0–34.0)
MCHC: 31.7 g/dL (ref 30.0–36.0)
MCV: 85 fL (ref 80.0–100.0)
Monocytes Absolute: 0.5 10*3/uL (ref 0.1–1.0)
Monocytes Relative: 7 %
Neutro Abs: 5.7 10*3/uL (ref 1.7–7.7)
Neutrophils Relative %: 73 %
Platelet Count: 400 10*3/uL (ref 150–400)
RBC: 5.12 MIL/uL (ref 4.22–5.81)
RDW: 14.4 % (ref 11.5–15.5)
WBC Count: 7.7 10*3/uL (ref 4.0–10.5)
nRBC: 0 % (ref 0.0–0.2)

## 2021-12-01 NOTE — Progress Notes (Signed)
?  [place] ?OFFICE PROGRESS NOTE ? ? ?Diagnosis: Polycythemia vera ? ?INTERVAL HISTORY:  ? ?Alejandro Ward returns as scheduled.  He continues ruxolitinib.  No bleeding or symptom of thrombosis.  He has occasional diarrhea. ? ?Objective: ? ?Vital signs in last 24 hours: ? ?Blood pressure 134/74, pulse 63, temperature 98.1 ?F (36.7 ?C), temperature source Oral, resp. rate 18, height '6\' 2"'$  (1.88 m), weight 193 lb 6.4 oz (87.7 kg), SpO2 98 %. ?  ? ?Resp: Distant breath sounds, clear bilaterally, no respiratory distress ?Cardio: Regular rate and rhythm ?GI: No hepatosplenomegaly ?Vascular: No leg edema ? ?Lab Results: ? ?Lab Results  ?Component Value Date  ? WBC 7.7 12/01/2021  ? HGB 13.8 12/01/2021  ? HCT 43.5 12/01/2021  ? MCV 85.0 12/01/2021  ? PLT 400 12/01/2021  ? NEUTROABS 5.7 12/01/2021  ? ? ?CMP  ?Lab Results  ?Component Value Date  ? NA 143 09/15/2021  ? K 4.7 09/15/2021  ? CL 110 09/15/2021  ? CO2 23 09/15/2021  ? GLUCOSE 72 09/15/2021  ? BUN 18 09/15/2021  ? CREATININE 1.20 09/15/2021  ? CALCIUM 9.1 09/15/2021  ? PROT 6.3 (L) 09/15/2021  ? ALBUMIN 4.1 09/15/2021  ? AST 31 09/15/2021  ? ALT 21 09/15/2021  ? ALKPHOS 51 09/15/2021  ? BILITOT 0.5 09/15/2021  ? GFRNONAA >60 09/15/2021  ? GFRAA >60 04/16/2020  ? ? ?No results found for: CEA1, CEA, [id number], CA125 ? ?Lab Results  ?Component Value Date  ? INR 1.1 04/04/2020  ? LABPROT 13.8 04/04/2020  ? ? ?Imaging: ? ?No results found. ? ?Medications: I have reviewed the patient's current medications. ? ? 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 ? ? ? ? ?Disposition: ?Alejandro Ward appears stable.  The hemoglobin and hematocrit are in goal range.  He will continue ruxolitinib at the current dose.  He will return for an office and lab visit on 01/19/2022. ? ?Betsy Coder, MD ? ?12/01/2021  ?8:07 AM ? ? ?

## 2021-12-22 ENCOUNTER — Other Ambulatory Visit (HOSPITAL_COMMUNITY): Payer: Self-pay

## 2021-12-30 ENCOUNTER — Other Ambulatory Visit (HOSPITAL_COMMUNITY): Payer: Self-pay

## 2021-12-30 ENCOUNTER — Other Ambulatory Visit: Payer: Self-pay | Admitting: Oncology

## 2021-12-31 ENCOUNTER — Encounter: Payer: Self-pay | Admitting: Family

## 2021-12-31 ENCOUNTER — Other Ambulatory Visit (HOSPITAL_COMMUNITY): Payer: Self-pay

## 2021-12-31 MED ORDER — RUXOLITINIB PHOSPHATE 5 MG PO TABS
5.0000 mg | ORAL_TABLET | Freq: Every day | ORAL | 0 refills | Status: DC
  Filled 2021-12-31: qty 30, 30d supply, fill #0

## 2022-01-01 ENCOUNTER — Other Ambulatory Visit (HOSPITAL_COMMUNITY): Payer: Self-pay

## 2022-01-12 ENCOUNTER — Inpatient Hospital Stay (HOSPITAL_BASED_OUTPATIENT_CLINIC_OR_DEPARTMENT_OTHER): Payer: Medicare Other | Admitting: Oncology

## 2022-01-12 ENCOUNTER — Encounter: Payer: Self-pay | Admitting: Oncology

## 2022-01-12 ENCOUNTER — Inpatient Hospital Stay: Payer: Medicare Other | Attending: Hematology & Oncology

## 2022-01-12 ENCOUNTER — Other Ambulatory Visit: Payer: Self-pay

## 2022-01-12 VITALS — BP 145/72 | HR 60 | Temp 98.2°F | Resp 18 | Ht 74.0 in | Wt 193.2 lb

## 2022-01-12 DIAGNOSIS — D45 Polycythemia vera: Secondary | ICD-10-CM

## 2022-01-12 DIAGNOSIS — E611 Iron deficiency: Secondary | ICD-10-CM | POA: Insufficient documentation

## 2022-01-12 DIAGNOSIS — D6959 Other secondary thrombocytopenia: Secondary | ICD-10-CM | POA: Diagnosis not present

## 2022-01-12 DIAGNOSIS — R161 Splenomegaly, not elsewhere classified: Secondary | ICD-10-CM | POA: Insufficient documentation

## 2022-01-12 LAB — CMP (CANCER CENTER ONLY)
ALT: 21 U/L (ref 0–44)
AST: 30 U/L (ref 15–41)
Albumin: 4 g/dL (ref 3.5–5.0)
Alkaline Phosphatase: 65 U/L (ref 38–126)
Anion gap: 9 (ref 5–15)
BUN: 21 mg/dL (ref 8–23)
CO2: 25 mmol/L (ref 22–32)
Calcium: 9.4 mg/dL (ref 8.9–10.3)
Chloride: 106 mmol/L (ref 98–111)
Creatinine: 1.28 mg/dL — ABNORMAL HIGH (ref 0.61–1.24)
GFR, Estimated: 60 mL/min — ABNORMAL LOW (ref >60)
Glucose, Bld: 106 mg/dL — ABNORMAL HIGH (ref 70–99)
Potassium: 4.2 mmol/L (ref 3.5–5.1)
Sodium: 140 mmol/L (ref 135–145)
Total Bilirubin: 0.5 mg/dL (ref 0.3–1.2)
Total Protein: 6.3 g/dL — ABNORMAL LOW (ref 6.5–8.1)

## 2022-01-12 LAB — CBC WITH DIFFERENTIAL (CANCER CENTER ONLY)
Abs Immature Granulocytes: 0.05 10*3/uL (ref 0.00–0.07)
Basophils Absolute: 0.2 10*3/uL — ABNORMAL HIGH (ref 0.0–0.1)
Basophils Relative: 2 %
Eosinophils Absolute: 0.2 10*3/uL (ref 0.0–0.5)
Eosinophils Relative: 3 %
HCT: 44.5 % (ref 39.0–52.0)
Hemoglobin: 13.6 g/dL (ref 13.0–17.0)
Immature Granulocytes: 1 %
Lymphocytes Relative: 14 %
Lymphs Abs: 1 10*3/uL (ref 0.7–4.0)
MCH: 24.3 pg — ABNORMAL LOW (ref 26.0–34.0)
MCHC: 30.6 g/dL (ref 30.0–36.0)
MCV: 79.5 fL — ABNORMAL LOW (ref 80.0–100.0)
Monocytes Absolute: 0.4 10*3/uL (ref 0.1–1.0)
Monocytes Relative: 6 %
Neutro Abs: 5.2 10*3/uL (ref 1.7–7.7)
Neutrophils Relative %: 74 %
Platelet Count: 416 10*3/uL — ABNORMAL HIGH (ref 150–400)
RBC: 5.6 MIL/uL (ref 4.22–5.81)
RDW: 16 % — ABNORMAL HIGH (ref 11.5–15.5)
WBC Count: 7 10*3/uL (ref 4.0–10.5)
nRBC: 0 % (ref 0.0–0.2)

## 2022-01-12 NOTE — Progress Notes (Signed)
  Alejandro Ward OFFICE PROGRESS NOTE   Diagnosis: Polycythemia vera  INTERVAL HISTORY:   Alejandro Ward returns as scheduled.  He continues ruxolitinib.  No pruritus or symptom of bleeding/thrombosis.  No complaint.  He feels well.  Objective:  Vital signs in last 24 hours:  Blood pressure (!) 145/72, pulse 60, temperature 98.2 F (36.8 C), temperature source Oral, resp. rate 18, height '6\' 2"'$  (1.88 m), weight 193 lb 3.2 oz (87.6 kg), SpO2 98 %.   Resp: Lungs clear bilaterally-distant breath sounds Cardio: Regular rate and rhythm GI: No hepatosplenomegaly Vascular: No leg edema  Lab Results:  Lab Results  Component Value Date   WBC 7.0 01/12/2022   HGB 13.6 01/12/2022   HCT 44.5 01/12/2022   MCV 79.5 (L) 01/12/2022   PLT 416 (H) 01/12/2022   NEUTROABS 5.2 01/12/2022    CMP  Lab Results  Component Value Date   NA 143 09/15/2021   K 4.7 09/15/2021   CL 110 09/15/2021   CO2 23 09/15/2021   GLUCOSE 72 09/15/2021   BUN 18 09/15/2021   CREATININE 1.20 09/15/2021   CALCIUM 9.1 09/15/2021   PROT 6.3 (L) 09/15/2021   ALBUMIN 4.1 09/15/2021   AST 31 09/15/2021   ALT 21 09/15/2021   ALKPHOS 51 09/15/2021   BILITOT 0.5 09/15/2021   GFRNONAA >60 09/15/2021   GFRAA >60 04/16/2020     Medications: I have reviewed the patient's current medications.   Assessment/Plan: Polycythemia vera, JAK2 positive Maintained on intermittent phlebotomy therapy since diagnosis in late 2019 Trial of hydroxyurea 02/08/2021, discontinued after approximately 5 days secondary to nausea, diarrhea, and pruritus Hydroxyurea 500 mg every other day starting 02/18/2021-he did not restart hydroxyurea Jakafi 10 mg twice daily 04/10/2021 Phlebotomy 05/01/2021 Jakafi dose weaned beginning 09/15/2021, now taking 5 mg daily Thrombocytosis secondary to #1 and iron deficiency Iron deficiency secondary to polycythemia and phlebotomy COVID-19 infection May 2022 Right leg cellulitis 2020 Splenomegaly  secondary to #1    Disposition: Alejandro Ward appears stable.  He is tolerating the ruxolitinib well.  The hemoglobin/hematocrit remain at the upper end of goal range.  The plan is to continue ruxolitinib at the current dose.  He will return for an office and lab visit in 6 weeks.  Betsy Coder, MD  01/12/2022  8:10 AM

## 2022-01-19 ENCOUNTER — Other Ambulatory Visit (HOSPITAL_COMMUNITY): Payer: Self-pay

## 2022-01-21 ENCOUNTER — Other Ambulatory Visit (HOSPITAL_COMMUNITY): Payer: Self-pay

## 2022-01-22 ENCOUNTER — Other Ambulatory Visit (HOSPITAL_COMMUNITY): Payer: Self-pay

## 2022-01-23 ENCOUNTER — Other Ambulatory Visit (HOSPITAL_COMMUNITY): Payer: Self-pay

## 2022-01-26 ENCOUNTER — Other Ambulatory Visit (HOSPITAL_COMMUNITY): Payer: Self-pay

## 2022-02-09 ENCOUNTER — Other Ambulatory Visit: Payer: Self-pay | Admitting: Oncology

## 2022-02-09 ENCOUNTER — Other Ambulatory Visit (HOSPITAL_COMMUNITY): Payer: Self-pay

## 2022-02-09 MED ORDER — RUXOLITINIB PHOSPHATE 5 MG PO TABS
5.0000 mg | ORAL_TABLET | Freq: Every day | ORAL | 0 refills | Status: DC
Start: 2022-02-09 — End: 2022-03-10
  Filled 2022-02-09 – 2022-02-16 (×2): qty 30, 30d supply, fill #0

## 2022-02-16 ENCOUNTER — Other Ambulatory Visit (HOSPITAL_COMMUNITY): Payer: Self-pay

## 2022-02-20 ENCOUNTER — Other Ambulatory Visit (HOSPITAL_COMMUNITY): Payer: Self-pay

## 2022-02-23 ENCOUNTER — Encounter: Payer: Self-pay | Admitting: Oncology

## 2022-02-23 ENCOUNTER — Other Ambulatory Visit (HOSPITAL_COMMUNITY): Payer: Self-pay

## 2022-02-23 ENCOUNTER — Inpatient Hospital Stay: Payer: Medicare Other | Attending: Hematology & Oncology

## 2022-02-23 ENCOUNTER — Inpatient Hospital Stay (HOSPITAL_BASED_OUTPATIENT_CLINIC_OR_DEPARTMENT_OTHER): Payer: Medicare Other | Admitting: Oncology

## 2022-02-23 ENCOUNTER — Inpatient Hospital Stay: Payer: Medicare Other

## 2022-02-23 ENCOUNTER — Telehealth: Payer: Self-pay

## 2022-02-23 ENCOUNTER — Other Ambulatory Visit: Payer: Self-pay | Admitting: *Deleted

## 2022-02-23 VITALS — BP 124/84 | HR 58 | Temp 98.1°F | Resp 18 | Ht 74.0 in | Wt 191.4 lb

## 2022-02-23 VITALS — BP 128/81 | HR 58 | Temp 98.1°F | Resp 17

## 2022-02-23 DIAGNOSIS — D45 Polycythemia vera: Secondary | ICD-10-CM | POA: Insufficient documentation

## 2022-02-23 DIAGNOSIS — R161 Splenomegaly, not elsewhere classified: Secondary | ICD-10-CM | POA: Diagnosis not present

## 2022-02-23 DIAGNOSIS — Z79899 Other long term (current) drug therapy: Secondary | ICD-10-CM | POA: Insufficient documentation

## 2022-02-23 DIAGNOSIS — Z8616 Personal history of COVID-19: Secondary | ICD-10-CM | POA: Insufficient documentation

## 2022-02-23 DIAGNOSIS — D75838 Other thrombocytosis: Secondary | ICD-10-CM | POA: Diagnosis not present

## 2022-02-23 DIAGNOSIS — Z1589 Genetic susceptibility to other disease: Secondary | ICD-10-CM

## 2022-02-23 DIAGNOSIS — L03115 Cellulitis of right lower limb: Secondary | ICD-10-CM | POA: Insufficient documentation

## 2022-02-23 DIAGNOSIS — E611 Iron deficiency: Secondary | ICD-10-CM | POA: Diagnosis not present

## 2022-02-23 LAB — CBC WITH DIFFERENTIAL (CANCER CENTER ONLY)
Abs Immature Granulocytes: 0.06 10*3/uL (ref 0.00–0.07)
Basophils Absolute: 0.2 10*3/uL — ABNORMAL HIGH (ref 0.0–0.1)
Basophils Relative: 2 %
Eosinophils Absolute: 0.2 10*3/uL (ref 0.0–0.5)
Eosinophils Relative: 3 %
HCT: 45.4 % (ref 39.0–52.0)
Hemoglobin: 14.2 g/dL (ref 13.0–17.0)
Immature Granulocytes: 1 %
Lymphocytes Relative: 15 %
Lymphs Abs: 1.1 10*3/uL (ref 0.7–4.0)
MCH: 23.7 pg — ABNORMAL LOW (ref 26.0–34.0)
MCHC: 31.3 g/dL (ref 30.0–36.0)
MCV: 75.9 fL — ABNORMAL LOW (ref 80.0–100.0)
Monocytes Absolute: 0.4 10*3/uL (ref 0.1–1.0)
Monocytes Relative: 6 %
Neutro Abs: 5.4 10*3/uL (ref 1.7–7.7)
Neutrophils Relative %: 73 %
Platelet Count: 402 10*3/uL — ABNORMAL HIGH (ref 150–400)
RBC: 5.98 MIL/uL — ABNORMAL HIGH (ref 4.22–5.81)
RDW: 18.8 % — ABNORMAL HIGH (ref 11.5–15.5)
WBC Count: 7.4 10*3/uL (ref 4.0–10.5)
nRBC: 0 % (ref 0.0–0.2)

## 2022-02-23 LAB — CMP (CANCER CENTER ONLY)
ALT: 20 U/L (ref 0–44)
AST: 25 U/L (ref 15–41)
Albumin: 4.2 g/dL (ref 3.5–5.0)
Alkaline Phosphatase: 60 U/L (ref 38–126)
Anion gap: 9 (ref 5–15)
BUN: 18 mg/dL (ref 8–23)
CO2: 25 mmol/L (ref 22–32)
Calcium: 9.2 mg/dL (ref 8.9–10.3)
Chloride: 106 mmol/L (ref 98–111)
Creatinine: 1.25 mg/dL — ABNORMAL HIGH (ref 0.61–1.24)
GFR, Estimated: >60 mL/min (ref >60)
Glucose, Bld: 99 mg/dL (ref 70–99)
Potassium: 4.1 mmol/L (ref 3.5–5.1)
Sodium: 140 mmol/L (ref 135–145)
Total Bilirubin: 0.6 mg/dL (ref 0.3–1.2)
Total Protein: 6.1 g/dL — ABNORMAL LOW (ref 6.5–8.1)

## 2022-02-23 NOTE — Progress Notes (Signed)
Referral order placed per patient request.

## 2022-02-23 NOTE — Patient Instructions (Signed)

## 2022-02-23 NOTE — Progress Notes (Signed)
Alejandro Ward presents today for phlebotomy per MD orders. Phlebotomy procedure started at 0900 and ended at 0911 using a 16 G phlebotomy kit in the Right AC 505 cc removed. IV needle removed. Patient tolerated procedure well.   Patient provided with snack and beverage after completion of phlebotomy Patient observed for 25 minutes after procedure per patient preference. VSS after observation.

## 2022-02-23 NOTE — Progress Notes (Signed)
  [place] OFFICE PROGRESS NOTE   Diagnosis: Polycythemia vera  INTERVAL HISTORY:   [name] returns as scheduled.  He continues Music therapist.  No nausea, diarrhea, pruritus, fever, bleeding, or symptom of thrombosis.  He feels well.  He will be relocating to the [place] area within the next few weeks.  Objective:  Vital signs in last 24 hours:  Blood pressure 124/84, pulse (!) 58, temperature 98.1 F (36.7 C), temperature source Oral, resp. rate 18, height '6\' 2"'$  (1.88 m), weight 191 lb 6.4 oz (86.8 kg), SpO2 98 %.    HEENT: No thrush or ulcers Resp: Lungs clear bilaterally, distant breath sounds Cardio: Regular rate and rhythm GI: No hepatosplenomegaly Vascular: No leg edema  Lab Results:  Lab Results  Component Value Date   WBC 7.4 02/23/2022   HGB 14.2 02/23/2022   HCT 45.4 02/23/2022   MCV 75.9 (L) 02/23/2022   PLT 402 (H) 02/23/2022   NEUTROABS 5.4 02/23/2022    CMP  Lab Results  Component Value Date   NA 140 01/12/2022   K 4.2 01/12/2022   CL 106 01/12/2022   CO2 25 01/12/2022   GLUCOSE 106 (H) 01/12/2022   BUN 21 01/12/2022   CREATININE 1.28 (H) 01/12/2022   CALCIUM 9.4 01/12/2022   PROT 6.3 (L) 01/12/2022   ALBUMIN 4.0 01/12/2022   AST 30 01/12/2022   ALT 21 01/12/2022   ALKPHOS 65 01/12/2022   BILITOT 0.5 01/12/2022   GFRNONAA 60 (L) 01/12/2022   GFRAA >60 04/16/2020     Medications: I have reviewed the patient's current medications.   Assessment/Plan: Polycythemia vera, JAK2 positive Maintained on intermittent phlebotomy therapy since diagnosis in late 2019 Trial of hydroxyurea 02/08/2021, discontinued after approximately 5 days secondary to nausea, diarrhea, and pruritus Hydroxyurea 500 mg every other day starting 02/18/2021-he did not restart hydroxyurea Jakafi 10 mg twice daily 04/10/2021 Phlebotomy 05/01/2021 Jakafi dose weaned beginning 09/15/2021, now taking 5 mg daily Phlebotomy 02/23/2022 Thrombocytosis secondary to #1  and iron deficiency Iron deficiency secondary to polycythemia and phlebotomy COVID-19 infection May 2022 Right leg cellulitis 2020 Splenomegaly secondary to #1      Disposition: Alejandro Ward appears unchanged.  The hematocrit is at the upper end of the goal range.  He would like to proceed with a phlebotomy therapy today.  He feels he is benefiting from the [place] and does not wish to discontinue this agent.  He will continue Jakafi.  Alejandro Ward will be scheduled for an office visit and CBC in 6 weeks.  He will call in the interim if he is able to arrange for hematology follow-up in [place].  Alejandro Coder, MD  02/23/2022  8:25 AM

## 2022-02-23 NOTE — Telephone Encounter (Signed)
TC to Pt following from message left. Pt was inquiring if increasing Jakafi from 5 mg to 7 mg would be better for him. Discussed with Dr Benay Spice who stated No to increasing medication.

## 2022-02-24 ENCOUNTER — Other Ambulatory Visit (HOSPITAL_COMMUNITY): Payer: Self-pay

## 2022-02-24 ENCOUNTER — Encounter: Payer: Self-pay | Admitting: Family

## 2022-02-24 ENCOUNTER — Encounter: Payer: Self-pay | Admitting: *Deleted

## 2022-02-24 NOTE — Progress Notes (Signed)
Faxed referral order, demographics and chart information to Dr. Irine Seal at [facility] per patient request. Fax [phone] att: Chong Sicilian [phone]

## 2022-02-27 ENCOUNTER — Telehealth: Payer: Self-pay | Admitting: *Deleted

## 2022-02-27 ENCOUNTER — Encounter: Payer: Self-pay | Admitting: Family

## 2022-02-27 ENCOUNTER — Ambulatory Visit (INDEPENDENT_AMBULATORY_CARE_PROVIDER_SITE_OTHER): Payer: Medicare Other | Admitting: Family

## 2022-02-27 VITALS — BP 121/71 | HR 52 | Temp 97.8°F | Ht 74.0 in | Wt 191.1 lb

## 2022-02-27 DIAGNOSIS — M25551 Pain in right hip: Secondary | ICD-10-CM | POA: Diagnosis not present

## 2022-02-27 MED ORDER — DICLOFENAC SODIUM 1 % EX GEL: 2.0000 g | Freq: Four times a day (QID) | CUTANEOUS | 3 refills | Status: DC | PRN

## 2022-02-27 MED ORDER — DICLOFENAC SODIUM 75 MG PO TBEC: 75.0000 mg | DELAYED_RELEASE_TABLET | Freq: Two times a day (BID) | ORAL | 0 refills | Status: AC

## 2022-02-27 NOTE — Patient Instructions (Signed)
It was very nice to see you today!    I have sent over an anti-inflammatory medication, Diclofenac Sodium, you can take twice a day with food. Also sent the topical gel version of the same medication, can rub this on your hip up to 4 times per day for relief.  Try not do any heavy lifting, pulling or pushing for the next week or 2 as you are able.  Have a great weekend!    PLEASE NOTE:  If you had any lab tests please let us know if you have not heard back within a few days. You may see your results on MyChart before we have a chance to review them but we will give you a call once they are reviewed by [place]. If we ordered any referrals today, please let us know if you have not heard from their office within the next week.

## 2022-02-27 NOTE — Telephone Encounter (Signed)
Left VM for referral coordinator to confirm they received all the records they need and if he has an appointment yet.

## 2022-02-27 NOTE — Progress Notes (Signed)
   Patient ID: Alejandro Ward, male    DOB: 09/04/1950, 71 y.o.   MRN: [id number]  Chief Complaint  Patient presents with   Hip Pain    Pt c/o throbbing right hip pain, Has been bothering him off and on for years. Usually take ibuprofen of tylenol which does help a little. Pt states sleeping on that right side makes it worse.     HPI: Right hip pain:   reports pain off and on, worse with ambulation but also hurts some with rest, describes as achy. Some relief with Tylenol and Ibuprofen, took 3 Ibuprofen with some relief. Denies any clicking, popping, locking, or instability in joint. Denies tenderness with palpation.    Assessment & Plan:  1. Acute right hip pain pt [place]. Has not had hip evaluated before, states pain is just off & on due mostly to overexertion at times. Sending Diclofenac oral & topical, advised on use & SE.   - diclofenac (VOLTAREN) 75 MG EC tablet; Take 1 tablet (75 mg total) by mouth 2 (two) times daily.  Dispense: 30 tablet; Refill: 0 - diclofenac Sodium (VOLTAREN) 1 % GEL; Apply 2 g topically 4 (four) times daily as needed.  Dispense: 50 g; Refill: 3   Subjective:    Outpatient Medications Prior to Visit  Medication Sig Dispense Refill   aspirin 81 MG EC tablet Take by mouth.     ruxolitinib phosphate (JAKAFI) 5 MG tablet Take 1 tablet (5 mg total) by mouth daily. 30 tablet 0   No facility-administered medications prior to visit.   Past Medical History:  Diagnosis Date   Polycythemia vera ([place])    Past Surgical History:  Procedure Laterality Date   NOSE SURGERY     broke as a child in football   No Known Allergies    Objective:    Physical Exam Vitals and nursing note reviewed.  Constitutional:      General: He is not in acute distress.    Appearance: Normal appearance.  HENT:     Head: Normocephalic.  Cardiovascular:     Rate and Rhythm: Normal rate and regular rhythm.  Pulmonary:      Effort: Pulmonary effort is normal.     Breath sounds: Normal breath sounds.  Musculoskeletal:     Cervical back: Normal range of motion.     Right hip: Tenderness present. No bony tenderness or crepitus. Decreased range of motion. Normal strength.  Skin:    General: Skin is warm and dry.  Neurological:     Mental Status: He is alert and oriented to person, place, and time.  Psychiatric:        Mood and Affect: Mood normal.    BP 121/71 (BP Location: Left Arm, Patient Position: Sitting, Cuff Size: Large)   Pulse (!) 52   Temp 97.8 F (36.6 C) (Temporal)   Ht '6\' 2"'$  (1.88 m)   Wt 191 lb 2 oz (86.7 kg)   SpO2 98%   BMI 24.54 kg/m  Wt Readings from Last 3 Encounters:  02/27/22 191 lb 2 oz (86.7 kg)  02/23/22 191 lb 6.4 oz (86.8 kg)  01/12/22 193 lb 3.2 oz (87.6 kg)       Jeanie Sewer, NP

## 2022-03-06 ENCOUNTER — Other Ambulatory Visit (HOSPITAL_COMMUNITY): Payer: Self-pay

## 2022-03-10 ENCOUNTER — Other Ambulatory Visit: Payer: Self-pay | Admitting: Oncology

## 2022-03-10 ENCOUNTER — Other Ambulatory Visit (HOSPITAL_COMMUNITY): Payer: Self-pay

## 2022-03-11 ENCOUNTER — Other Ambulatory Visit (HOSPITAL_COMMUNITY): Payer: Self-pay

## 2022-03-11 ENCOUNTER — Encounter: Payer: Self-pay | Admitting: Family

## 2022-03-11 MED ORDER — RUXOLITINIB PHOSPHATE 5 MG PO TABS
5.0000 mg | ORAL_TABLET | Freq: Every day | ORAL | 1 refills | Status: AC
  Filled 2022-03-11: qty 30, 30d supply, fill #0

## 2022-03-31 ENCOUNTER — Other Ambulatory Visit (HOSPITAL_COMMUNITY): Payer: Self-pay

## 2022-04-02 ENCOUNTER — Other Ambulatory Visit (HOSPITAL_COMMUNITY): Payer: Self-pay

## 2022-04-02 ENCOUNTER — Telehealth: Payer: Self-pay | Admitting: Pharmacy Technician

## 2022-04-02 NOTE — Telephone Encounter (Signed)
Oral Oncology Patient Advocate Encounter   Began application for assistance for Jakafi through [employer].   Application has been submitted. Pending final benefits verification.   [employer] phone number [phone].   I will continue to check the status until final determination.   Lady Deutscher, CPhT-Adv Oncology Pharmacy Patient [place] Direct Number: [phone]  Fax: [phone]

## 2022-04-03 ENCOUNTER — Other Ambulatory Visit (HOSPITAL_COMMUNITY): Payer: Self-pay

## 2022-04-03 NOTE — Telephone Encounter (Signed)
Oral Oncology Patient Advocate Encounter  Received update via fax from [facility] regarding patient assistance application for Jakafi.  Status is pending POI. Income documents submitted via e-fax to [phone].   I will continue to check status until final determination.  Lady Deutscher, CPhT-Adv Oncology Pharmacy Patient [place] Direct Number: [phone]  Fax: [phone]

## 2022-04-07 ENCOUNTER — Inpatient Hospital Stay: Payer: Medicare Other

## 2022-04-07 ENCOUNTER — Inpatient Hospital Stay: Payer: Medicare Other | Admitting: Oncology

## 2022-04-07 ENCOUNTER — Other Ambulatory Visit (HOSPITAL_COMMUNITY): Payer: Self-pay

## 2022-04-07 NOTE — Telephone Encounter (Signed)
Oral Oncology Patient Advocate Encounter   Began appeal process for assistance for Jakafi through [employer].  I have spoken to the patient. They are aware that they must submit a formal letter to the program requesting an appeal and explaining the depth of their financial need.   I have sent a request from our office for an appeal, along with a copy of the patient's POI, to [employer].   [employer] phone number [phone] Fax [phone].   I will continue to check the status until final determination.   Lady Deutscher, CPhT-Adv Oncology Pharmacy Patient [place] Direct Number: [phone]  Fax: [phone]

## 2022-04-07 NOTE — Telephone Encounter (Signed)
Oral Oncology Patient Advocate Encounter   Received notification that the application for assistance for Jakafi through [facility] has been denied; Patient does not meet income eligibility requirements.   [employer] phone number [phone].   Lady Deutscher, CPhT-Adv Oncology Pharmacy Patient [place] Direct Number: [phone]  Fax: [phone]

## 2022-04-10 ENCOUNTER — Other Ambulatory Visit (HOSPITAL_COMMUNITY): Payer: Self-pay

## 2022-04-10 NOTE — Telephone Encounter (Signed)
Oral Oncology Patient Advocate Encounter  Called to check status of assistance application for Jakafi.  Was provided an alternate fax for appeal submission [phone].  Spoke to patient and advised it may be faster for them to reach out directly to the program by phone. They expressed agreement.   [employer] phone [phone]  I will continue to check status until final determination.  Lady Deutscher, CPhT-Adv Oncology Pharmacy Patient [place] Direct Number: [phone]  Fax: [phone]

## 2022-04-13 ENCOUNTER — Encounter: Payer: Self-pay | Admitting: *Deleted

## 2022-04-13 ENCOUNTER — Other Ambulatory Visit (HOSPITAL_COMMUNITY): Payer: Self-pay

## 2022-04-13 NOTE — Telephone Encounter (Signed)
Oral Oncology Patient Advocate Encounter  Was notified by nurse that patient may no longer be under provider's care.  Spoke to patient and confirmed that they had transferred care to a new provider. I advised the patient that we would no longer be able to follow the case if they are no longer an active patient of Dr. Benay Spice.   Patient voiced understanding.  Lady Deutscher, CPhT-Adv Oncology Pharmacy Patient [place] Direct Number: [phone]  Fax: [phone]

## 2022-04-28 ENCOUNTER — Other Ambulatory Visit (HOSPITAL_COMMUNITY): Payer: Self-pay

## 2022-10-29 ENCOUNTER — Encounter: Payer: Self-pay | Admitting: Oncology

## 2023-02-21 ENCOUNTER — Other Ambulatory Visit: Payer: Self-pay | Admitting: Family

## 2023-02-21 DIAGNOSIS — M25551 Pain in right hip: Secondary | ICD-10-CM
